# Patient Record
Sex: Female | Born: 1980 | Race: Black or African American | Hispanic: No | Marital: Single | Smoking: Never smoker
Health system: Southern US, Community
[De-identification: ages and names within clinical notes are randomized; demographics above are authoritative.]

## PROBLEM LIST (undated history)

## (undated) DIAGNOSIS — N809 Endometriosis, unspecified: Secondary | ICD-10-CM

## (undated) DIAGNOSIS — K589 Irritable bowel syndrome without diarrhea: Secondary | ICD-10-CM

## (undated) DIAGNOSIS — E785 Hyperlipidemia, unspecified: Secondary | ICD-10-CM

## (undated) DIAGNOSIS — F909 Attention-deficit hyperactivity disorder, unspecified type: Secondary | ICD-10-CM

## (undated) DIAGNOSIS — F32A Depression, unspecified: Secondary | ICD-10-CM

## (undated) DIAGNOSIS — I10 Essential (primary) hypertension: Secondary | ICD-10-CM

## (undated) DIAGNOSIS — F419 Anxiety disorder, unspecified: Secondary | ICD-10-CM

## (undated) HISTORY — DX: Endometriosis, unspecified: N80.9

## (undated) HISTORY — DX: Irritable bowel syndrome without diarrhea: K58.9

## (undated) HISTORY — DX: Hyperlipidemia, unspecified: E78.5

## (undated) HISTORY — DX: Essential (primary) hypertension: I10

## (undated) HISTORY — DX: Attention-deficit hyperactivity disorder, unspecified type: F90.9

## (undated) HISTORY — PX: ABDOMINAL HYSTERECTOMY: SHX81

## (undated) HISTORY — DX: Anxiety disorder, unspecified: F41.9

## (undated) HISTORY — DX: Depression, unspecified: F32.A

---

## 2003-04-14 HISTORY — PX: COLONOSCOPY: SHX174

## 2010-04-13 HISTORY — PX: LAPAROSCOPIC ENDOMETRIOSIS FULGURATION: SUR769

## 2010-04-30 ENCOUNTER — Ambulatory Visit
Admission: RE | Admit: 2010-04-30 | Disposition: A | Discharge status: Home / Self Care | Payer: Self-pay | Source: Ambulatory Visit | Admitting: Gynecology

## 2010-04-30 ENCOUNTER — Ambulatory Visit: Payer: Self-pay

## 2010-05-01 LAB — LAB USE ONLY - HISTORICAL SURGICAL PATHOLOGY

## 2011-02-11 NOTE — Op Note (Signed)
TIMBER, MARSHMAN      MRN:          [id number]      Account:      [id number]      Document ID:  [id number]      Procedure Date: 04/30/2010            Admit Date: 04/30/2010            Patient Location: [facility]      Patient Type: A            SURGEON: Corene Cornea MD      ASSISTANT:                  PREOPERATIVE DIAGNOSIS:      Endometriosis.            POSTOPERATIVE DIAGNOSIS:      Endometriosis.            TITLE OF PROCEDURE:      Laparoscopic resection of endometriosis and enterolysis.            COMPLICATIONS:      None.            CONDITION:      Stable.            ESTIMATED BLOOD LOSS:      Minimal.            DESCRIPTION OF PROCEDURE:      The patient was taken to the operating room.  General anesthesia was      started.  She was prepped and draped in the dorsal lithotomy position.  A      uterine manipulator was placed.  Attention was then turned to the abdomen.      A 5-mm skin incision was made in the umbilicus.  A 5-mm trocar was inserted      under direct visualization of 0-degree scope.  The abdomen was insufflated      with CO2.  One suprapubic 5-mm trocar was inserted.  The pelvis was      examined.  Both tubes and ovaries were found to be normal.  The ovaries did      not appear polycystic at all.  They were normal in shape and appearance      without multiple peripheral cysts.  Both tubes appeared normal.  Uterus      normal.  No fibroids visualized.  There was a small adhesion between the      cecum and the pelvic sidewall.  This was taken down.  The posterior      cul-de-sac was examined.  There was noted to be an implant localized to the      posterior aspect of the uterus and the left uterosacral ligament.  This was      resected using sharp dissection.  Bipolar cautery was used for hemostasis.      The specimen was sent for pathology.  No other implants were visualized.      The pelvis was irrigated.  All instruments and gas removed.  There was a      keloid at the umbilical incision,  which was excised.  Skin reapproximated                                   Page 1 of 2      KENZINGTON, MIELKE      MRN:  [id number]      Account:      [id number]      Document ID:  [id number]      Procedure Date: 04/30/2010            using 0 Vicryl.  Kenalog injection applied to both suprapubic and umbilical      incision for reduction of keloid formation.                        Electronic Signing Provider            D:  04/30/2010 09:44 AM by Dr. Corene Cornea[place] MD ([id number])      T:  04/30/2010 10:27 AM by [id number]                  cc:                                   Page 2 of 2      Authenticated by Corene Cornea, MD ([id number]) On 04/30/2010 12:33:12 PM

## 2012-04-13 HISTORY — PX: COLPOSCOPY: SHX161

## 2014-11-14 ENCOUNTER — Other Ambulatory Visit: Payer: Self-pay | Admitting: Otolaryngology

## 2014-11-14 DIAGNOSIS — J329 Chronic sinusitis, unspecified: Secondary | ICD-10-CM

## 2014-11-19 ENCOUNTER — Ambulatory Visit: Payer: PRIVATE HEALTH INSURANCE

## 2015-02-25 ENCOUNTER — Other Ambulatory Visit: Payer: Self-pay

## 2015-05-20 ENCOUNTER — Emergency Department: Payer: PRIVATE HEALTH INSURANCE

## 2015-05-20 ENCOUNTER — Emergency Department
Admission: EM | Admit: 2015-05-20 | Discharge: 2015-05-20 | Disposition: A | Discharge status: Home / Self Care | Payer: PRIVATE HEALTH INSURANCE | Attending: Emergency Medicine | Admitting: Emergency Medicine

## 2015-05-20 DIAGNOSIS — T2010XA Burn of first degree of head, face, and neck, unspecified site, initial encounter: Secondary | ICD-10-CM | POA: Insufficient documentation

## 2015-05-20 DIAGNOSIS — X088XXA Exposure to other specified smoke, fire and flames, initial encounter: Secondary | ICD-10-CM | POA: Insufficient documentation

## 2015-05-20 DIAGNOSIS — T300 Burn of unspecified body region, unspecified degree: Secondary | ICD-10-CM

## 2015-05-20 NOTE — ED Provider Notes (Signed)
EMERGENCY DEPARTMENT NOTE    Physician/Midlevel provider first contact with patient: 05/20/15 1651         HISTORY OF PRESENT ILLNESS   Historian:Patient  Translator Used: No    Chief Complaint: Burn and Abscess     Mechanism of Injury: Nursing (triage) note reviewed for the following pertinent information:    Pt has bumps on the back of her head and used a hot compress on them, now feels oozing and is concerned she may have burned her scalp. No redness not to area of pain.      34 y.o. female with head pain    1. Location of symptoms: scalp  2. Onset of symptoms: Saturday  3. What was patient doing when symptoms started (Context): see above  4. Severity: moderate  5. Timing: constant  6. Activities that worsen symptoms: touching scalp  7. Activities that improve symptoms: bacitracin  8. Quality: burning pain  9. Radiation of symptoms: no  10. Associated signs and Symptoms: see above  11. Are symptoms worsening? yes  MEDICAL HISTORY     Past Medical History:  No past medical history on file.    Past Surgical History:  No past surgical history on file.    Social History:  Social History     Social History   . Marital Status: Single     Spouse Name: N/A   . Number of Children: N/A   . Years of Education: N/A     Occupational History   . Not on file.     Social History Main Topics   . Smoking status: Not on file   . Smokeless tobacco: Not on file   . Alcohol Use: Not on file   . Drug Use: Not on file   . Sexual Activity: Not on file     Other Topics Concern   . Not on file     Social History Narrative   . No narrative on file       Family History:  No family history on file.    Outpatient Medication:  Previous Medications    No medications on file     REVIEW OF SYSTEMS   Review of Systems   Skin: Positive for rash.     PHYSICAL EXAM   ED Triage Vitals   Enc Vitals Group      BP 05/20/15 1647 167/107 mmHg      Heart Rate 05/20/15 1647 100      Resp Rate 05/20/15 1647 17      Temp 05/20/15 1647 98.4 F (36.9 C)       Temp src --       SpO2 05/20/15 1647 99 %      Weight 05/20/15 1647 61.054 kg      Height 05/20/15 1647 1.575 m      Head Cir --       Peak Flow --       Pain Score 05/20/15 1647 3      Pain Loc --       Pain Edu? --       Excl. in GC? --      General: awake, alert, nontoxic.   EENT: mucous membranes moist  Eyes: Lids normal, no conjunctival injection, no discharge  Lungs: No work of breathing. No tachypnea  CV: regular rhythm.   Abdomen: No distention.   MSK: upper and lower extremities warm and well perfused, neck exhibits FROM by observation  Skin: no rash, skin  warm, skin dry  Neuro: Moving extremities symmetrically and well by observation, no focal weakness, no facial asymetry  Psych: Normal behavior. Developmentally appropriate for age. Appropriate dress      MEDICAL DECISION MAKING     DISCUSSION    Scalp pain after used hot compress and burned scalp.  No open lesions. No erythema. No findings on PE.    Ddx: 1st degree burn.    Plan: dispo with pain control education.      Vital Signs: Reviewed the patient?s vital signs.   Nursing Notes: Reviewed and utilized available nursing notes.  Medical Records Reviewed: Reviewed available past medical records.  Counseling: The emergency provider has spoken with the patient and discussed today?s findings, in addition to providing specific details for the plan of care.  Questions are answered and there is agreement with the plan.      PULSE OXIMETRY    Oxygen Saturation by Pulse Oximetry: 99%  Interventions: none  Interpretation:  Normal pulse ox reading    EMERGENCY DEPT. MEDICATIONS      ED Medication Orders     None          LABORATORY RESULTS    Ordered and independently interpreted AVAILABLE laboratory tests. Please see results section in chart for full details.  No results found for this or any previous visit.      DIAGNOSIS      Diagnosis:  Final diagnoses:   Burn       Disposition:  ED Disposition     Discharge Priscille Kluver discharge to home/self  care.    Condition at disposition: Stable            Prescriptions:  Patient's Medications    No medications on file         Audria Nine, NP  05/20/15 1719

## 2015-05-20 NOTE — Discharge Instructions (Signed)
Burns    You have been seen for a burn.    There are three types of burns:   First-degree burns. These are relatively minor burns on the very top layer of skin. The skin is red and painful but there are no blisters. These burns normally heal without scars. A bad sunburn is a type of first-degree burn.   Second-degree burns. These burns are more serious. They involve deeper layers of the skin. The skin is red, painful, with blisters. Second-degree burns can cause scars.   Third-degree burns. These burns involve deep layers of the skin. They always cause some scars. These burns may or may not be painful.    Take off old dressings every day. Put on a clean, dry dressing. If the dressing sticks to the wound, slightly moisten it with water. This way, it can come off easier.    Put an antibiotic ointment on the burn several times a day. Cover it with a clean, dry dressing. You can buy Polysporin ointment, Silvadene cream, and Bacitracin ointment at the store.    YOU SHOULD SEEK MEDICAL ATTENTION IMMEDIATELY, EITHER HERE OR AT THE NEAREST EMERGENCY DEPARTMENT, IF ANY OF THE FOLLOWING OCCURS:   You see redness or swelling.   There are red streaks coming out from the wound.   The wound smells bad or has a lot of drainage.   Pain when moving the extremities (arms or legs) and / or swollen lymph nodes (nodules normally found in the groin, armpit and neck).   You have fever (temperature higher than 100.4F / 38C), chills, worse pain and / or swelling.

## 2015-05-20 NOTE — ED Notes (Signed)
See ED notes.

## 2016-01-18 DIAGNOSIS — D573 Sickle-cell trait: Secondary | ICD-10-CM

## 2016-01-18 DIAGNOSIS — N809 Endometriosis, unspecified: Secondary | ICD-10-CM | POA: Insufficient documentation

## 2016-01-18 HISTORY — DX: Sickle-cell trait: D57.3

## 2016-03-13 DIAGNOSIS — N840 Polyp of corpus uteri: Secondary | ICD-10-CM

## 2016-03-13 HISTORY — DX: Polyp of corpus uteri: N84.0

## 2016-04-07 ENCOUNTER — Ambulatory Visit: Payer: Commercial Managed Care - POS

## 2016-04-15 ENCOUNTER — Ambulatory Visit
Admission: RE | Admit: 2016-04-15 | Discharge: 2016-04-15 | Disposition: A | Discharge status: Home / Self Care | Payer: Commercial Managed Care - POS | Source: Ambulatory Visit | Attending: Obstetrics & Gynecology | Admitting: Obstetrics & Gynecology

## 2016-04-15 ENCOUNTER — Ambulatory Visit: Payer: Commercial Managed Care - POS | Admitting: Obstetrics & Gynecology

## 2016-04-15 ENCOUNTER — Ambulatory Visit: Payer: Commercial Managed Care - POS | Admitting: Certified Registered"

## 2016-04-15 ENCOUNTER — Encounter
Admission: RE | Disposition: A | Discharge status: Home / Self Care | Payer: Self-pay | Source: Ambulatory Visit | Attending: Obstetrics & Gynecology

## 2016-04-15 ENCOUNTER — Ambulatory Visit: Payer: Self-pay

## 2016-04-15 DIAGNOSIS — Z8742 Personal history of other diseases of the female genital tract: Secondary | ICD-10-CM | POA: Insufficient documentation

## 2016-04-15 DIAGNOSIS — N840 Polyp of corpus uteri: Secondary | ICD-10-CM

## 2016-04-15 HISTORY — PX: D & C, HYSTEROSCOPY: SHX3660

## 2016-04-15 SURGERY — HYSTEROSCOPY, ENDOMETRIAL BIOPSY/POLYPECTOMY, WITH DILATION AND CURETTAGE (D&C)
Anesthesia: Anesthesia General | Site: Pelvis | Wound class: Clean Contaminated

## 2016-04-15 MED ORDER — PROPOFOL 10 MG/ML IV EMUL (WRAP)
INTRAVENOUS | Status: AC
Start: 2016-04-15 — End: ?
  Filled 2016-04-15: qty 20

## 2016-04-15 MED ORDER — MIDAZOLAM HCL 2 MG/2ML IJ SOLN
INTRAMUSCULAR | Status: AC
Start: 2016-04-15 — End: ?
  Filled 2016-04-15: qty 2

## 2016-04-15 MED ORDER — DEXAMETHASONE SODIUM PHOSPHATE 4 MG/ML IJ SOLN (WRAP)
INTRAMUSCULAR | Status: DC | PRN
Start: 2016-04-15 — End: 2016-04-15
  Administered 2016-04-15: 8 mg via INTRAVENOUS

## 2016-04-15 MED ORDER — LIDOCAINE HCL 1 % IJ SOLN
INTRAMUSCULAR | Status: AC
Start: 2016-04-15 — End: ?
  Filled 2016-04-15: qty 20

## 2016-04-15 MED ORDER — LIDOCAINE HCL (PF) 1 % IJ SOLN
INTRAMUSCULAR | Status: DC | PRN
Start: 2016-04-15 — End: 2016-04-15
  Administered 2016-04-15: 10 mL

## 2016-04-15 MED ORDER — FENTANYL CITRATE (PF) 50 MCG/ML IJ SOLN (WRAP)
INTRAMUSCULAR | Status: DC | PRN
Start: 2016-04-15 — End: 2016-04-15
  Administered 2016-04-15 (×2): 50 ug via INTRAVENOUS

## 2016-04-15 MED ORDER — CEFAZOLIN SODIUM 1 G IJ SOLR
INTRAMUSCULAR | Status: AC
Start: 2016-04-15 — End: ?
  Filled 2016-04-15: qty 1000

## 2016-04-15 MED ORDER — PROMETHAZINE HCL 25 MG/ML IJ SOLN
INTRAMUSCULAR | Status: DC | PRN
Start: 2016-04-15 — End: 2016-04-15
  Administered 2016-04-15: 6.25 mg via INTRAMUSCULAR

## 2016-04-15 MED ORDER — LIDOCAINE HCL (PF) 2 % IJ SOLN
INTRAMUSCULAR | Status: AC
Start: 2016-04-15 — End: ?
  Filled 2016-04-15: qty 5

## 2016-04-15 MED ORDER — LIDOCAINE HCL 2 % IJ SOLN
INTRAMUSCULAR | Status: DC | PRN
Start: 2016-04-15 — End: 2016-04-15
  Administered 2016-04-15: 100 mg

## 2016-04-15 MED ORDER — SILVER NITRATE-POT NITRATE 75-25 % EX MISC
CUTANEOUS | Status: DC | PRN
Start: 2016-04-15 — End: 2016-04-15
  Administered 2016-04-15: 1 via TOPICAL

## 2016-04-15 MED ORDER — ONDANSETRON HCL 4 MG/2ML IJ SOLN
INTRAMUSCULAR | Status: AC
Start: 2016-04-15 — End: ?
  Filled 2016-04-15: qty 4

## 2016-04-15 MED ORDER — LACTATED RINGERS IV SOLN
INTRAVENOUS | Status: DC | PRN
Start: 2016-04-15 — End: 2016-04-15

## 2016-04-15 MED ORDER — DEXAMETHASONE SODIUM PHOSPHATE 4 MG/ML IJ SOLN
INTRAMUSCULAR | Status: AC
Start: 2016-04-15 — End: ?
  Filled 2016-04-15: qty 2

## 2016-04-15 MED ORDER — KETOROLAC TROMETHAMINE 30 MG/ML IJ SOLN
INTRAMUSCULAR | Status: DC | PRN
Start: 2016-04-15 — End: 2016-04-15
  Administered 2016-04-15: 30 mg via INTRAVENOUS

## 2016-04-15 MED ORDER — HYDROCODONE-ACETAMINOPHEN 5-325 MG PO TABS
1.0000 | ORAL_TABLET | Freq: Once | ORAL | Status: DC | PRN
Start: 2016-04-15 — End: 2016-04-15

## 2016-04-15 MED ORDER — PROMETHAZINE HCL 25 MG/ML IJ SOLN
INTRAMUSCULAR | Status: AC
Start: 2016-04-15 — End: ?
  Filled 2016-04-15: qty 1

## 2016-04-15 MED ORDER — ONDANSETRON HCL 4 MG/2ML IJ SOLN
INTRAMUSCULAR | Status: DC | PRN
Start: 2016-04-15 — End: 2016-04-15
  Administered 2016-04-15: 8 mg via INTRAVENOUS

## 2016-04-15 MED ORDER — FENTANYL CITRATE (PF) 50 MCG/ML IJ SOLN (WRAP)
INTRAMUSCULAR | Status: AC
Start: 2016-04-15 — End: ?
  Filled 2016-04-15: qty 2

## 2016-04-15 MED ORDER — CEFAZOLIN SODIUM 1 G IJ SOLR
INTRAMUSCULAR | Status: DC | PRN
Start: 2016-04-15 — End: 2016-04-15
  Administered 2016-04-15: 1 g via INTRAVENOUS

## 2016-04-15 MED ORDER — MIDAZOLAM HCL 2 MG/2ML IJ SOLN
INTRAMUSCULAR | Status: DC | PRN
Start: 2016-04-15 — End: 2016-04-15
  Administered 2016-04-15: 2 mg via INTRAVENOUS

## 2016-04-15 MED ORDER — SUCCINYLCHOLINE CHLORIDE 20 MG/ML IJ SOLN
INTRAMUSCULAR | Status: DC | PRN
Start: 2016-04-15 — End: 2016-04-15
  Administered 2016-04-15: 60 mg via INTRAVENOUS

## 2016-04-15 MED ORDER — PROPOFOL 10 MG/ML IV EMUL (WRAP)
INTRAVENOUS | Status: DC | PRN
Start: 2016-04-15 — End: 2016-04-15
  Administered 2016-04-15 (×2): 200 mg via INTRAVENOUS

## 2016-04-15 SURGICAL SUPPLY — 20 items
CATH URETHRAL RED RUBBER 16F (Catheter Urine) ×1 IMPLANT
COVER LIGHT HANDLE 2PK (Procedure Accessories) ×2 IMPLANT
GLOVE SRG NTR RBR 8 INDCTR BGL 299X103MM (Glove) ×2
GLOVE SURG BIOGEL SZ7.5 (Glove) ×2 IMPLANT
GLOVE SURGICAL 8 INDICATOR BIOGEL POWDER (Glove) ×1
GLOVE SURGICAL 8 INDICATOR BIOGEL POWDER FREE SMOOTH BEAD CUFF (Glove) ×1 IMPLANT
NEEDLE SPINAL BD OD22 GA L3 1/2 IN (Needles) ×1
NEEDLE SPINAL L3 1/2 IN REGULAR WALL QUINCKE TIP OD22 GA BD (Needles) IMPLANT
NEEDLE SPNL PP RW BD QNCK 22GA 3.5IN LF (Needles) ×2
PACK LITHOTOMY MINOR (Pack) ×2 IMPLANT
SET IRR DEHP 10 GTT/ML STRG 81IN LF STRL (Tubing) ×2
SET IRRIGATION L81 IN 10 GTT/ML STRAIGHT (Tubing) ×1
SET IRRIGATION L81 IN 10 GTT/ML STRAIGHT NA DEHP BLADDER REGULATE (Tubing) ×1 IMPLANT
SOLUTION IRR 0.9% NACL 1000ML LF STRL (Irrigation Solutions) ×2
SOLUTION IRRIGATION 0.9% SODIUM CHLORIDE (Irrigation Solutions) ×1
SOLUTION IRRIGATION 0.9% SODIUM CHLORIDE 1000 ML PLASTIC POUR BOTTLE (Irrigation Solutions) ×1 IMPLANT
SYRINGE 10 ML CONTROL CONCENTRIC TIP (Syringes, Needles) ×1
SYRINGE 10 ML CONTROL CONCENTRIC TIP PYROGEN FREE DEHP FREE LOK (Syringes, Needles) IMPLANT
SYRINGE MED 10ML LL LF STRL CNTRL CONC (Syringes, Needles) ×2
UNDERPAD INCNT PP SMP TNDRSRB 36X23IN LF (Patient Supply) ×2 IMPLANT

## 2016-04-15 NOTE — Anesthesia Postprocedure Evaluation (Signed)
Anesthesia Post Evaluation    Patient: Erika Mccarty    Procedures performed: Procedure(s):  D & C, HYSTEROSCOPY    Anesthesia type: General LMA    Patient location:PACU    Last vitals:   Vitals:    04/15/16 1027   BP: 141/81   Pulse: 75   Resp: 15   Temp:    SpO2: 100%       Post pain: Patient not complaining of pain, continue current therapy      Mental Status:awake and alert     Respiratory Function: tolerating room air    Cardiovascular: stable    Nausea/Vomiting: patient not complaining of nausea or vomiting    Hydration Status: adequate    Post assessment: no apparent anesthetic complications    Signed by: Manuella Ghazi , 04/15/2016 2:19 PM

## 2016-04-15 NOTE — Transfer of Care (Signed)
Anesthesia Transfer of Care Note    Patient: Erika Mccarty    Procedures performed: Procedure(s):  D & C, HYSTEROSCOPY    Anesthesia type: General LMA    Patient location:Phase I PACU    Last vitals:   Vitals:    04/15/16 0930   BP: 120/66   Pulse: (!) 111   Resp: 16   Temp: 36.1 C (97 F)   SpO2: 96%       Post pain: Patient not complaining of pain, continue current therapy      Mental Status:drowsy    Respiratory Function: tolerating face mask    Cardiovascular: stable    Nausea/Vomiting: patient not complaining of nausea or vomiting    Hydration Status: adequate    Post assessment: no apparent anesthetic complications, no reportable events and no evidence of recall    Signed by: Almyra Free  04/15/16 9:30 AM

## 2016-04-15 NOTE — Discharge Instr - AVS First Page (Signed)
Reason for your Hospital Admission:  D&C      Instructions for after your discharge:    Discharge Instructions for Dilatation and Curettage (D and C)  Your doctor performed dilatation and curettage (D&C). The reasons for having this procedure vary from person to person. The D&C may be performed to control heavy uterine bleeding, to find the cause of irregular bleeding, or to remove pregnancy tissue if you have had a miscarriage.  Home Care   Take it easy.    Return to your normal activities after 24-48 hours. You may also return to work at that time.   Eat a normal diet.   Take an over-the-counter pain reliever for pain, if needed.   Remember, it's okay to have bleeding for about a week after the procedure. The amount of bleeding should be similar to what you have during a normal period.   Don't lift anything heavier than 10 pounds for 1 week after the procedure.   Don't drive for 24 hours after the procedure.   Don't have sexual intercourse or use tampons or douches until your doctor says it's safe to do so. This usually takes 2 weeks.  Follow-Up   Make a follow-up appointment in 2 weeks.    When to Call Your Doctor  Call your doctor immediately if you have any of the following:   Bleeding that soaks more than one sanitary pad in one hour   Severe abdominal pain   Severe cramps   Fever above 101.0F   Chills   Smelly discharge from your vagina

## 2016-04-15 NOTE — Discharge Instructions (Signed)

## 2016-04-15 NOTE — Anesthesia Preprocedure Evaluation (Signed)
Anesthesia Evaluation    AIRWAY    Mallampati: II    TM distance: >3 FB  Neck ROM: full  Mouth Opening:full   CARDIOVASCULAR    cardiovascular exam normal       DENTAL    no notable dental hx     PULMONARY    pulmonary exam normal     OTHER FINDINGS              Relevant Problems   No active problems are marked relevant to this note.               Anesthesia Plan    ASA 2     general                     intravenous induction   Detailed anesthesia plan: general LMA        Post op pain management: per surgeon    informed consent obtained    Plan discussed with CRNA.                   Signed by: Manuella Ghazi  04/15/16 8:12 AM

## 2016-04-15 NOTE — Op Note (Signed)
Operative Report    Date of Procedure:  04/15/2016    Patient Name:  Erika Mccarty    Pre-Op Dx:  Polyp of corpus uteri    Post-Op Dx:  same    Procedure:  Procedure(s):  D & C, HYSTEROSCOPY    Surgeon:  Surgeon(s):  Marveline Profeta, Graciella Belton, MD    Assist:  Circulator: Lina Sayre, RN  Scrub Person: Mayford Knife, MONICA    Anesthesia:  Choice    Complications:  none    EBL: Minimal        Specimen:      SPECIMENS (last 24 hours)      Pathology Specimens     Row Name 04/15/16 0900                Specimen Information    Specimen Testing Required Routine Pathology       Specimen ID  [id number]       Specimen Description endometrial currettings and polyp             Findings: sub-centimeter polyp noted, otherwise normal EM cavity      Procedure:    After informed consent, the patient was taken to the operating room where adequate anesthesia was obtained.  She was prepped and draped in the usual sterile fashion, and examination revealed findings above.  A bivalve speculum was placed in the vagina, and the anterior lip of the cervix was grasped with a single tooth tenaculum.  Paracervical block was performed with 10 mL of 1% lidocaine.  The cervix was serially dilated, and the hysteroscope was advanced into the uterus with normal saline as the distension medium.  Bilateral tubal ostia were identified.  There was noted to be a small polyp in the uterine cavity.  Gentle sharp curettage was then performed.  All instruments were removed from the uterus and cervix with hemostasis noted on the cervix.  Normal saline input and output .  The patient tolerated the procedure well.  All sponge, instrument, and needle counts were correct times two.        Gardiner Fanti, MD  04/15/2016 9:05 AM

## 2016-04-17 ENCOUNTER — Encounter: Payer: Self-pay | Admitting: Obstetrics & Gynecology

## 2016-04-21 LAB — LAB USE ONLY - HISTORICAL SURGICAL PATHOLOGY

## 2016-05-18 ENCOUNTER — Emergency Department: Payer: Commercial Managed Care - POS

## 2016-05-18 ENCOUNTER — Emergency Department
Admission: EM | Admit: 2016-05-18 | Discharge: 2016-05-18 | Disposition: A | Discharge status: Home / Self Care | Payer: Commercial Managed Care - POS | Attending: Emergency Medicine | Admitting: Emergency Medicine

## 2016-05-18 DIAGNOSIS — M79661 Pain in right lower leg: Secondary | ICD-10-CM | POA: Insufficient documentation

## 2016-05-18 MED ORDER — IBUPROFEN 600 MG PO TABS
600.0000 mg | ORAL_TABLET | Freq: Once | ORAL | Status: AC
Start: 2016-05-18 — End: 2016-05-18
  Administered 2016-05-18: 600 mg via ORAL
  Filled 2016-05-18: qty 1

## 2016-05-18 MED ORDER — IBUPROFEN 600 MG PO TABS
600.0000 mg | ORAL_TABLET | Freq: Three times a day (TID) | ORAL | 0 refills | Status: DC | PRN
Start: 2016-05-18 — End: 2016-06-24

## 2016-05-18 NOTE — ED Provider Notes (Signed)
EMERGENCY DEPARTMENT HISTORY AND PHYSICAL EXAM    Date: 05/18/2016  Patient Name: Erika Mccarty  Attending Physician:  Kelly Splinter, MD  Diagnosis and Treatment Plan       Clinical Impression:   1. Right calf pain        Treatment Plan:   ED Disposition     ED Disposition Condition Date/Time Comment    Discharge  Mon May 18, 2016  2:07 PM Erika Mccarty discharge to home/self care.    Condition at disposition: Stable          History of Presenting Illness     Chief Complaint   Patient presents with   . Leg Pain     right lower leg       History Provided By: Pt  Chief Complaint: R calf pain    Additional History: Erika Mccarty is a 36 y.o. female c/o of R calf pain s/p rolling her ankle a month ago. States pain initially started 1 month ago. She never had x ray. Her tightness sensation in calf has since worsened. Describes it as "like my muscles are all tangled." She notes no relief from Motrin/Tylenol. She hasn't taken it today. She is not on birth control. She is not a smoker. Denies possibility of pregnancy.    Denies any CP, heart palpitations, R leg numbness, or other complaints.    PCP: Pollie Meyer, MD    No current facility-administered medications for this encounter.     Current Outpatient Prescriptions:   .  ibuprofen (ADVIL,MOTRIN) 600 MG tablet, Take 1 tablet (600 mg total) by mouth every 8 (eight) hours as needed for Pain., Disp: 20 tablet, Rfl: 0  .  sertraline (ZOLOFT) 50 MG tablet, take 1 tablet by mouth once daily, Disp: , Rfl: 0    Past Medical History     Past Medical History:   Diagnosis Date   . Anxiety    . Depression    . Endometriosis    . Irritable bowel syndrome    . Uterine polyp 03/2016     Past Surgical History:   Procedure Laterality Date   . D & C, HYSTEROSCOPY N/A 04/15/2016    Procedure: D & C, HYSTEROSCOPY;  Surgeon: Ahdoot, Graciella Belton, MD;  Location: [facility];  Service: Gynecology;  Laterality: N/A;   . LAPAROSCOPIC ENDOMETRIOSIS FULGURATION  04/2010    Lap resection of  endometriosis       Family History     Family History   Problem Relation Age of Onset   . Malignant hyperthermia Neg Hx    . Anesthesia problems Neg Hx        Social History     Social History     Social History   . Marital status: Single     Spouse name: N/A   . Number of children: N/A   . Years of education: N/A     Social History Main Topics   . Smoking status: Never Smoker   . Smokeless tobacco: Never Used   . Alcohol use No   . Drug use: No   . Sexual activity: Not on file     Other Topics Concern   . Not on file     Social History Narrative   . No narrative on file       Allergies     No Known Allergies    Review of Systems     Review of Systems  Cardiovascular: Negative for chest pain and palpitations.   Musculoskeletal:        + R calf pain   Allergic/Immunologic:        NKDA   Neurological: Negative for numbness.   Psychiatric/Behavioral: Negative for suicidal ideas.   Patient asked and all other systems reviewed and are negative for acute complaints/concerns.    Physical Exam     BP 140/88   Pulse 89   Temp 98.7 F (37.1 C) (Oral)   Resp 17   Wt 65.8 kg   LMP 04/26/2016   SpO2 100%   BMI 26.52 kg/m   Pulse Oximetry Analysis - Normal 100% On RA    Physical Exam   Constitutional: She is oriented to person, place, and time. She appears well-developed and well-nourished.   Head: Normocephalic and atraumatic.   Eyes: No scleral icterus.   Neck: Normal range of motion. Neck supple.   Cardiovascular: Intact distal pulses.    Pulmonary/Chest: Effort normal. No respiratory distress.   Neurological: She is alert and oriented to person, place, and time.   Skin: Skin is warm and dry.   Psychiatric: She has a normal mood and affect. Her behavior is normal. Judgment and thought content normal.   Elevated bp noted  2+ dp and pt pulses, normal sensation toes and good cap refill, no pain with arom/prom of the toes and ankle, the calf on the right is larger than the left but no pitting edema, no rash, no palpable  deformity but calf is tender    Diagnostic Study Results     Labs -     Results     ** No results found for the last 24 hours. **          Radiologic Studies -   Radiology Results (24 Hour)     Procedure Component Value Units Date/Time    [place] VenoDopp Low Extremity Right [[id number]] Collected:  05/18/16 1357    Order Status:  Completed Updated:  05/18/16 1402    Narrative:       CLINICAL INDICATIONS: Right lower extremity pain and swelling with  question of DVT.    TECHNIQUE: Duplex evaluation of the veins of the right lower extremity  is performed from the lower pelvis to the upper calf with gray scale  imaging, transverse compression and gated and color Doppler techniques.  Additional imaging is performed in the contralateral iliofemoral region.    INTERPRETATION: Examination of the venous system of the right lower  extremity demonstrates no evidence of intraluminal thrombus or  obstruction to venous flow. Normal phasicity is present at the  iliofemoral junctions indicating no central obstruction. There is normal  coaptation of the femoropopliteal vein throughout its course with  transverse compression. The saphenofemoral junction is also noted to be  widely patent. Deep and superficial veins of the right calf are also  demonstrated to be patent without thrombus or obstruction. When compared  to the contralateral iliofemoral junction, phasicity is normal and  symmetric.      Impression:        NORMAL VENOUS DUPLEX OF THE RIGHT LOWER EXTREMITY. NO  EVIDENCE OF DEEP VENOUS THROMBOSIS.    This study was read in conjunction with Dr. Alphonzo Grieve.    Ronalee Red, MD   05/18/2016 1:58 PM    Ankle Right 3+ Views [[id number]] Collected:  05/18/16 1242    Order Status:  Completed Updated:  05/18/16 1247    Narrative:  History:Eversion injury, pain    Comparison: None    Findings:  No acute fracture or dislocation. Mild pretibial soft tissue prominence.  Ankle mortise is intact.      Impression:         No acute osseous findings.        Lorinda Creed, MD   05/18/2016 12:43 PM    Tibia Fibula Right AP and Lateral [[id number]] Collected:  05/18/16 1242    Order Status:  Completed Updated:  05/18/16 1247    Narrative:              History:Eversion injury, pain    Comparison: None    Findings:  No acute fracture or dislocation. Mild pretibial soft tissue prominence.  Ankle mortise is intact.      Impression:        No acute osseous findings.        Lorinda Creed, MD   05/18/2016 12:43 PM      .    Doctor's Notes     Throughout the stay in the Emergency Department, questions and concerns surrounding pain control, care plans, diagnostic studies, effects of medications administered or prescribed, and future prognostic dilemmas were assessed and addressed.    ROS addendum: The patient and/or family was asked if they had any other complaints or concerns that we could address today and nothing of significance was noted.     IMP & PLAN: possible muscle injury with edema, no signs compartment syndrome, denies pe sxs  Here for ultrasound, given the swollen calf we will also ensure no occult fx    12:13 PM - Discussed treatment plan with pt. Pt is agreeable with plan.    1:55 PM - Updated pt on all results. She's agreeable with plan.    2:07 PM - Rx use and side effects, results, home self care, discharge instructions, and return precautions discussed extensively with patient. Possibility of evolving illness reviewed. All questions solicited and addressed. Patient is amenable to discharge. bp improved and likely reflective of pain/stress.  Ambulating normally.    Asymptomatic hypertension treatment today as per ACEP guidelines, no new medications, no acute adjustment in pressure and no screening  labs/ekg/cts:    [email or web address]  _______________________________  Medical DeMedical Decision Makingcision Making  Attestations:     Physician/Midlevel provider first contact with patient: 05/18/16 1205         This note is prepared for Kelly Splinter, MD. The scribe's documentation has been prepared under my direction and personally reviewed by me in its entirety.  I confirm that the note above accurately reflects all work, treatment, procedures, and medical decision making performed by me.     I am the first provider for this patient.      Kelly Splinter, MD is the primary emergency doctor of record.      I reviewed the vital signs, available nursing notes, past medical history, past surgical history, family history and social history.    _______________________________         Westley Foots, MD  05/19/16 [phone]

## 2016-05-18 NOTE — ED Triage Notes (Signed)
Erika Mccarty is a 36 y.o. female who presents to ED with c/o right LE pain after rolling her right ankle approximately 1 month ago. States "it feels like the muscles are all twisted". [facility] and advised to come to ED for potential DVT. Reports occasional SOB, denies fevers, dizziness, CP.     BP (!) 173/109   Pulse (!) 101   Temp 98 F (36.7 C)   Resp 18   Wt 65.8 kg   LMP 04/26/2016   SpO2 97%   BMI 26.52 kg/m

## 2016-05-18 NOTE — Discharge Instructions (Signed)
Leg Pain    You have been diagnosed with leg pain.  IN YOUR CASE YOU HAVE LIKELY INJURED THE MUSCLES AS DISCUSSED AND THERE CAN BE BE SWELLING AS THE MUSCLES HEAL.    Many things can cause leg pain. Most of the causes are not dangerous and will get better on their own. These can include muscle cramps, bruises, strains, pinched nerves, and minor skin infections.    You had an exam by your doctor today. He or she decided that the cause of your leg pain does not seem to be serious or dangerous.    During your visit, you had an ultrasound (duplex doppler) to make sure that you didn't have a blood clot in your leg.    If your pain continues, you might need another exam or more tests. This is to find out why you have leg pain. The cause of your symptoms doesn't seem dangerous now. You don't need to stay in the hospital.    Though we don't believe your condition is dangerous right now, it is important to be careful. Sometimes a problem that seems mild can become serious later. This is why it is very important that you return here or go to the nearest Emergency Department if you are not improving or your symptoms are getting worse.    Some things you may try at home are:    Over-the-counter pain medications like that have ibuprofen (Advil/Motrin) or acetaminophen (Tylenol) in them. Follow the directions on the package.   Rest the leg as needed. As soon as you are able, start moving your leg again to keep it from getting stiff.    Return here or go to the nearest Emergency Department or follow-up with your doctor if you are not getting better as expected.    Follow the instructions for any medication you are prescribed.     YOU SHOULD SEEK MEDICAL ATTENTION IMMEDIATELY, EITHER HERE OR AT THE NEAREST EMERGENCY DEPARTMENT, IF ANY OF THE FOLLOWING HAPPENS:   You have a fever (temperature higher than 100.53F or 38C).   Your pain does not go away or gets worse.   Your leg or joints (hip, knee, ankle, etc.) are  red or swollen.   Your chest hurts or you get short of breath.   You have any other symptoms or concerns, or don't get better as expected.    If you can't follow up with your doctor, or if at any time you feel you need to be rechecked or seen again, come back here or go to the nearest emergency department.       If your swelling worsens or doesn't get better in 6 weeks you need to return for review.  If you have a rash or fever you must return.  If you have chest pain or palpitations or shortness of breath you must return.  If you have numbness or swelling in the foot or if your foot because pale you must return.  If you have pain with wiggling your does or feel you have weakness in your ankle you must return.

## 2016-06-24 ENCOUNTER — Ambulatory Visit: Payer: Commercial Managed Care - POS

## 2016-06-24 ENCOUNTER — Encounter (HOSPITAL_BASED_OUTPATIENT_CLINIC_OR_DEPARTMENT_OTHER): Payer: Self-pay

## 2016-06-24 DIAGNOSIS — Z01818 Encounter for other preprocedural examination: Secondary | ICD-10-CM

## 2016-06-24 NOTE — Pre-Procedure Instructions (Addendum)
06/19/16 cbc, cmp - wdl in epic  Pt plans to come to [facility] on 07/04/16 for preop testing T&S, orders entered according to anesthesia guidelines.  No further tests per guidelines, nor requested from sx per pt.     Lab Tech: need T&S

## 2016-07-04 ENCOUNTER — Ambulatory Visit: Payer: Commercial Managed Care - POS | Attending: Gynecologic Oncology

## 2016-07-04 DIAGNOSIS — Z01818 Encounter for other preprocedural examination: Secondary | ICD-10-CM

## 2016-07-04 LAB — TYPE AND SCREEN
AB Screen Gel: NEGATIVE
ABO Rh: B POS

## 2016-07-06 NOTE — Pre-Procedure Instructions (Signed)
Reviewed 07/04/16 type and screen.

## 2016-07-13 NOTE — H&P (Signed)
GYNONC Preop H&P      This H&P was written from available EPIC records and attendings office notes.       Erika Mccarty is a 36 y.o. yo  presenting to [facility] for scheduled surgery.    Review of Systems (per o/p Records on date 04/14/2016)  Denies fever, chills, headache, visual changes, lightheadedness, dizziness, SOB, chest pain, palpitations, N/V, diarrhea, constipation, dysuria, hematuria      PMH:  Past Medical History:   Diagnosis Date   . Endometriosis    . Irritable bowel syndrome     controled w/diet   . Sickle cell trait 01/18/2016    no issues   . Uterine polyp 03/2016    surgery - 04/2016       PSHX:  Past Surgical History:   Procedure Laterality Date   . COLONOSCOPY  2005   . COLPOSCOPY  04/2012   . D & C, HYSTEROSCOPY N/A 04/15/2016    Procedure: D & C, HYSTEROSCOPY;  Surgeon: Ahdoot, Graciella Belton, MD;  Location: [facility] MA[facility] OR;  Service: Gynecology;  Laterality: N/A;   . LAPAROSCOPIC ENDOMETRIOSIS FULGURATION  04/2010    Lap resection of endometriosis       Fam hx:  Family History   Problem Relation Age of Onset   . Malignant hyperthermia Neg Hx    . Anesthesia problems Neg Hx        Soc Hx:  Social History     Social History   . Marital status: Single     Spouse name: N/A   . Number of children: N/A   . Years of education: N/A     Social History Main Topics   . Smoking status: Never Smoker   . Smokeless tobacco: Never Used   . Alcohol use No   . Drug use: No   . Sexual activity: Not on file     Other Topics Concern   . Not on file     Social History Narrative   . No narrative on file       Meds:  No prescriptions prior to admission.       All:  No Known Allergies    PE:  There were no vitals filed for this visit.    Exam:  (Per o/p records on date 04/14/2016)    General: NAD  Heart: RRR  Lungs: CTAB    Rest of the exam deferred for OR      A/P: 37 y.o. with endometrial hyperplasia with atypia, here for  Laparoscopic Total Hysterectomy , BSO.  Proceed with scheduled surgery.  ERAS protocol  Lovenox and SCDs  pre-op  2g Ancef pre-op    Erika Burton, MD     Please note that this note will be addended on day of surgery by surgical attending

## 2016-07-16 ENCOUNTER — Encounter (HOSPITAL_BASED_OUTPATIENT_CLINIC_OR_DEPARTMENT_OTHER): Payer: Self-pay

## 2016-07-16 ENCOUNTER — Inpatient Hospital Stay (HOSPITAL_BASED_OUTPATIENT_CLINIC_OR_DEPARTMENT_OTHER): Payer: Commercial Managed Care - POS | Admitting: Certified Registered Nurse Anesthetist

## 2016-07-16 ENCOUNTER — Ambulatory Visit
Admission: RE | Admit: 2016-07-16 | Discharge: 2016-07-17 | Disposition: A | Discharge status: Home / Self Care | Payer: Commercial Managed Care - POS | Source: Ambulatory Visit | Attending: Gynecologic Oncology | Admitting: Gynecologic Oncology

## 2016-07-16 ENCOUNTER — Encounter (HOSPITAL_BASED_OUTPATIENT_CLINIC_OR_DEPARTMENT_OTHER)
Admission: RE | Disposition: A | Discharge status: Home / Self Care | Payer: Self-pay | Source: Ambulatory Visit | Attending: Gynecologic Oncology

## 2016-07-16 ENCOUNTER — Ambulatory Visit (HOSPITAL_BASED_OUTPATIENT_CLINIC_OR_DEPARTMENT_OTHER): Payer: Commercial Managed Care - POS | Admitting: Gynecologic Oncology

## 2016-07-16 DIAGNOSIS — K589 Irritable bowel syndrome without diarrhea: Secondary | ICD-10-CM | POA: Insufficient documentation

## 2016-07-16 DIAGNOSIS — D251 Intramural leiomyoma of uterus: Secondary | ICD-10-CM | POA: Insufficient documentation

## 2016-07-16 DIAGNOSIS — N800 Endometriosis of uterus: Secondary | ICD-10-CM

## 2016-07-16 DIAGNOSIS — N8502 Endometrial intraepithelial neoplasia [EIN]: Secondary | ICD-10-CM | POA: Insufficient documentation

## 2016-07-16 DIAGNOSIS — N838 Other noninflammatory disorders of ovary, fallopian tube and broad ligament: Secondary | ICD-10-CM | POA: Insufficient documentation

## 2016-07-16 DIAGNOSIS — I10 Essential (primary) hypertension: Secondary | ICD-10-CM | POA: Insufficient documentation

## 2016-07-16 HISTORY — PX: LAPAROSCOPIC, HYSTERECTOMY, TOTAL, BSO: SHX4523

## 2016-07-16 HISTORY — PX: CYSTOSCOPY: SHX3552

## 2016-07-16 LAB — POCT PREGNANCY TEST, URINE HCG: POCT Pregnancy HCG Test, UR: NEGATIVE

## 2016-07-16 SURGERY — LAPAROSCOPIC, HYSTERECTOMY, TOTAL, BSO
Anesthesia: Anesthesia General | Site: Pelvis | Wound class: Clean Contaminated

## 2016-07-16 MED ORDER — ACETAMINOPHEN 500 MG PO TABS
1000.0000 mg | ORAL_TABLET | Freq: Once | ORAL | Status: DC | PRN
Start: 2016-07-16 — End: 2016-07-16

## 2016-07-16 MED ORDER — PHENYLEPHRINE 100 MCG/ML IN NACL 0.9% IV SOSY
PREFILLED_SYRINGE | INTRAVENOUS | Status: AC
Start: 2016-07-16 — End: ?
  Filled 2016-07-16: qty 5

## 2016-07-16 MED ORDER — DEXAMETHASONE SODIUM PHOSPHATE 20 MG/5ML IJ SOLN
INTRAMUSCULAR | Status: AC
Start: 2016-07-16 — End: ?
  Filled 2016-07-16: qty 5

## 2016-07-16 MED ORDER — ONDANSETRON HCL 4 MG/2ML IJ SOLN
4.0000 mg | Freq: Once | INTRAMUSCULAR | Status: DC | PRN
Start: 2016-07-16 — End: 2016-07-16

## 2016-07-16 MED ORDER — BUPIVACAINE-EPINEPHRINE (PF) 0.25% -1:200000 IJ SOLN
INTRAMUSCULAR | Status: AC
Start: 2016-07-16 — End: ?
  Filled 2016-07-16: qty 30

## 2016-07-16 MED ORDER — ENOXAPARIN SODIUM 40 MG/0.4ML SC SOLN
40.0000 mg | Freq: Once | SUBCUTANEOUS | Status: AC
Start: 2016-07-16 — End: 2016-07-16
  Administered 2016-07-16: 40 mg via SUBCUTANEOUS

## 2016-07-16 MED ORDER — PROCHLORPERAZINE EDISYLATE 5 MG/ML IJ SOLN
5.0000 mg | Freq: Four times a day (QID) | INTRAMUSCULAR | Status: DC | PRN
Start: 2016-07-16 — End: 2016-07-17

## 2016-07-16 MED ORDER — FENTANYL CITRATE (PF) 50 MCG/ML IJ SOLN (WRAP)
INTRAMUSCULAR | Status: DC | PRN
Start: 2016-07-16 — End: 2016-07-16
  Administered 2016-07-16: 100 ug via INTRAVENOUS
  Administered 2016-07-16 (×2): 50 ug via INTRAVENOUS

## 2016-07-16 MED ORDER — GLYCOPYRROLATE 0.2 MG/ML IJ SOLN
INTRAMUSCULAR | Status: DC | PRN
Start: 2016-07-16 — End: 2016-07-16
  Administered 2016-07-16: .5 mg via INTRAVENOUS

## 2016-07-16 MED ORDER — PROPOFOL 10 MG/ML IV EMUL (WRAP)
INTRAVENOUS | Status: AC
Start: 2016-07-16 — End: ?
  Filled 2016-07-16: qty 50

## 2016-07-16 MED ORDER — OXYCODONE HCL 5 MG PO TABS
5.0000 mg | ORAL_TABLET | ORAL | Status: DC | PRN
Start: 2016-07-16 — End: 2016-07-17
  Administered 2016-07-16 (×2): 5 mg via ORAL
  Filled 2016-07-16 (×2): qty 1

## 2016-07-16 MED ORDER — ONDANSETRON HCL 4 MG/2ML IJ SOLN
4.0000 mg | Freq: Four times a day (QID) | INTRAMUSCULAR | Status: DC | PRN
Start: 2016-07-16 — End: 2016-07-17

## 2016-07-16 MED ORDER — NEOSTIGMINE METHYLSULFATE 1 MG/ML IJ/IV SOLN (WRAP)
Status: AC
Start: 2016-07-16 — End: ?
  Filled 2016-07-16: qty 5

## 2016-07-16 MED ORDER — MIDAZOLAM HCL 2 MG/2ML IJ SOLN
INTRAMUSCULAR | Status: DC | PRN
Start: 2016-07-16 — End: 2016-07-16
  Administered 2016-07-16: 2 mg via INTRAVENOUS

## 2016-07-16 MED ORDER — GABAPENTIN 100 MG PO CAPS
ORAL_CAPSULE | ORAL | Status: AC
Start: 2016-07-16 — End: ?
  Filled 2016-07-16: qty 1

## 2016-07-16 MED ORDER — SIMETHICONE 80 MG PO CHEW
80.0000 mg | CHEWABLE_TABLET | Freq: Four times a day (QID) | ORAL | Status: DC | PRN
Start: 2016-07-16 — End: 2016-07-17
  Administered 2016-07-16 – 2016-07-17 (×2): 80 mg via ORAL
  Filled 2016-07-16 (×2): qty 1

## 2016-07-16 MED ORDER — PROPOFOL 10 MG/ML IV EMUL (WRAP)
INTRAVENOUS | Status: DC | PRN
Start: 2016-07-16 — End: 2016-07-16
  Administered 2016-07-16: 50 mg via INTRAVENOUS
  Administered 2016-07-16: 150 mg via INTRAVENOUS

## 2016-07-16 MED ORDER — ACETAMINOPHEN 325 MG PO TABS
650.0000 mg | ORAL_TABLET | Freq: Four times a day (QID) | ORAL | Status: DC
Start: 2016-07-16 — End: 2016-07-17
  Administered 2016-07-16 – 2016-07-17 (×4): 650 mg via ORAL
  Filled 2016-07-16 (×4): qty 2

## 2016-07-16 MED ORDER — OXYCODONE HCL 5 MG PO TABS
5.0000 mg | ORAL_TABLET | ORAL | 0 refills | Status: AC | PRN
Start: 2016-07-16 — End: ?

## 2016-07-16 MED ORDER — ONDANSETRON HCL 4 MG/2ML IJ SOLN
INTRAMUSCULAR | Status: AC
Start: 2016-07-16 — End: ?
  Filled 2016-07-16: qty 2

## 2016-07-16 MED ORDER — LACTATED RINGERS IV SOLN
INTRAVENOUS | Status: DC
Start: 2016-07-16 — End: 2016-07-16

## 2016-07-16 MED ORDER — OXYCODONE HCL 5 MG PO TABS
10.0000 mg | ORAL_TABLET | ORAL | Status: DC | PRN
Start: 2016-07-16 — End: 2016-07-17

## 2016-07-16 MED ORDER — PROPOFOL INFUSION 10 MG/ML
INTRAVENOUS | Status: DC | PRN
Start: 2016-07-16 — End: 2016-07-16
  Administered 2016-07-16: 50 ug/kg/min via INTRAVENOUS

## 2016-07-16 MED ORDER — LIDOCAINE HCL 2 % IJ SOLN
INTRAMUSCULAR | Status: DC | PRN
Start: 2016-07-16 — End: 2016-07-16
  Administered 2016-07-16: 100 mg

## 2016-07-16 MED ORDER — ACETAMINOPHEN 500 MG PO TABS
ORAL_TABLET | ORAL | Status: AC
Start: 2016-07-16 — End: ?
  Filled 2016-07-16: qty 2

## 2016-07-16 MED ORDER — FLUORESCEIN SODIUM 10 % IV SOLN
INTRAVENOUS | Status: DC | PRN
Start: 2016-07-16 — End: 2016-07-16
  Administered 2016-07-16: .25 mL via INTRAVENOUS

## 2016-07-16 MED ORDER — TRAMADOL HCL 50 MG PO TABS
50.0000 mg | ORAL_TABLET | Freq: Four times a day (QID) | ORAL | Status: DC
Start: 2016-07-17 — End: 2016-07-16

## 2016-07-16 MED ORDER — STERILE WATER FOR IRRIGATION IR SOLN
Status: DC | PRN
Start: 2016-07-16 — End: 2016-07-16
  Administered 2016-07-16: 1000 mL
  Administered 2016-07-16: 200 mL

## 2016-07-16 MED ORDER — DEXAMETHASONE SODIUM PHOSPHATE 4 MG/ML IJ SOLN (WRAP)
INTRAMUSCULAR | Status: DC | PRN
Start: 2016-07-16 — End: 2016-07-16
  Administered 2016-07-16: 6 mg via INTRAVENOUS

## 2016-07-16 MED ORDER — SODIUM CHLORIDE 0.9 % IV SOLN
INTRAVENOUS | Status: DC
Start: 2016-07-16 — End: 2016-07-17

## 2016-07-16 MED ORDER — LACTATED RINGERS IV SOLN
125.0000 mL/h | INTRAVENOUS | Status: DC
Start: 2016-07-16 — End: 2016-07-16
  Administered 2016-07-16: 125 mL/h via INTRAVENOUS

## 2016-07-16 MED ORDER — ONDANSETRON 4 MG PO TBDP
4.0000 mg | ORAL_TABLET | Freq: Four times a day (QID) | ORAL | Status: DC | PRN
Start: 2016-07-16 — End: 2016-07-17

## 2016-07-16 MED ORDER — FENTANYL CITRATE (PF) 50 MCG/ML IJ SOLN (WRAP)
50.0000 ug | INTRAMUSCULAR | Status: DC | PRN
Start: 2016-07-16 — End: 2016-07-16
  Filled 2016-07-16: qty 2

## 2016-07-16 MED ORDER — HYDRALAZINE HCL 20 MG/ML IJ SOLN
10.0000 mg | Freq: Once | INTRAMUSCULAR | Status: DC | PRN
Start: 2016-07-16 — End: 2016-07-16

## 2016-07-16 MED ORDER — DSS 100 MG PO CAPS
100.0000 mg | ORAL_CAPSULE | Freq: Two times a day (BID) | ORAL | 0 refills | Status: AC
Start: 2016-07-16 — End: ?

## 2016-07-16 MED ORDER — HYDROMORPHONE HCL 0.5 MG/0.5 ML IJ SOLN
0.2000 mg | INTRAMUSCULAR | Status: DC | PRN
Start: 2016-07-16 — End: 2016-07-16

## 2016-07-16 MED ORDER — PHENYLEPHRINE HCL 10 MG/ML IV SOLN (WRAP)
Status: DC | PRN
Start: 2016-07-16 — End: 2016-07-16
  Administered 2016-07-16: 100 ug via INTRAVENOUS
  Administered 2016-07-16: 50 ug via INTRAVENOUS

## 2016-07-16 MED ORDER — OXYCODONE HCL 5 MG PO TABS
ORAL_TABLET | ORAL | Status: AC
Start: 2016-07-16 — End: ?
  Filled 2016-07-16: qty 1

## 2016-07-16 MED ORDER — ROCURONIUM BROMIDE 10 MG/ML IV SOLN (WRAP)
INTRAVENOUS | Status: DC | PRN
Start: 2016-07-16 — End: 2016-07-16
  Administered 2016-07-16: 10 mg via INTRAVENOUS
  Administered 2016-07-16: 40 mg via INTRAVENOUS

## 2016-07-16 MED ORDER — MIDAZOLAM HCL 2 MG/2ML IJ SOLN
INTRAMUSCULAR | Status: AC
Start: 2016-07-16 — End: ?
  Filled 2016-07-16: qty 2

## 2016-07-16 MED ORDER — FAMOTIDINE 20 MG/2ML IV SOLN
INTRAVENOUS | Status: AC
Start: 2016-07-16 — End: ?
  Filled 2016-07-16: qty 2

## 2016-07-16 MED ORDER — ENOXAPARIN SODIUM 40 MG/0.4ML SC SOLN
SUBCUTANEOUS | Status: AC
Start: 2016-07-16 — End: ?
  Filled 2016-07-16: qty 0.4

## 2016-07-16 MED ORDER — DOCUSATE SODIUM 100 MG PO CAPS
100.0000 mg | ORAL_CAPSULE | Freq: Two times a day (BID) | ORAL | Status: DC
Start: 2016-07-16 — End: 2016-07-17
  Administered 2016-07-16: 18:00:00 100 mg via ORAL
  Filled 2016-07-16: qty 1

## 2016-07-16 MED ORDER — BUPIVACAINE-EPINEPHRINE (PF) 0.25% -1:200000 IJ SOLN
INTRAMUSCULAR | Status: DC | PRN
Start: 2016-07-16 — End: 2016-07-16
  Administered 2016-07-16: 10 mL via INTRAMUSCULAR

## 2016-07-16 MED ORDER — OXYCODONE HCL 5 MG PO TABS
5.0000 mg | ORAL_TABLET | ORAL | Status: DC | PRN
Start: 2016-07-16 — End: 2016-07-16
  Administered 2016-07-16: 5 mg via ORAL

## 2016-07-16 MED ORDER — GLYCOPYRROLATE 0.2 MG/ML IJ SOLN
INTRAMUSCULAR | Status: AC
Start: 2016-07-16 — End: ?
  Filled 2016-07-16: qty 3

## 2016-07-16 MED ORDER — CEFAZOLIN SODIUM-DEXTROSE 2-3 GM-% IV SOLR
2.0000 g | INTRAVENOUS | Status: AC
Start: 2016-07-16 — End: 2016-07-16
  Administered 2016-07-16: 2 g via INTRAVENOUS

## 2016-07-16 MED ORDER — NEOSTIGMINE METHYLSULFATE 1 MG/ML IJ/IV SOLN (WRAP)
Status: DC | PRN
Start: 2016-07-16 — End: 2016-07-16
  Administered 2016-07-16: 3 mg via INTRAVENOUS

## 2016-07-16 MED ORDER — ALPRAZOLAM 0.25 MG PO TABS
0.2500 mg | ORAL_TABLET | Freq: Every day | ORAL | Status: DC
Start: 2016-07-17 — End: 2016-07-17

## 2016-07-16 MED ORDER — ACETAMINOPHEN 500 MG PO TABS
1000.0000 mg | ORAL_TABLET | Freq: Once | ORAL | Status: AC
Start: 2016-07-16 — End: 2016-07-16
  Administered 2016-07-16: 1000 mg via ORAL

## 2016-07-16 MED ORDER — FLUORESCEIN SODIUM 10 % IV SOLN
INTRAVENOUS | Status: AC
Start: 2016-07-16 — End: ?
  Filled 2016-07-16: qty 5

## 2016-07-16 MED ORDER — PROPOFOL 10 MG/ML IV EMUL (WRAP)
INTRAVENOUS | Status: AC
Start: 2016-07-16 — End: ?
  Filled 2016-07-16: qty 20

## 2016-07-16 MED ORDER — CEFAZOLIN SODIUM-DEXTROSE 2-3 GM-% IV SOLR
INTRAVENOUS | Status: AC
Start: 2016-07-16 — End: ?
  Filled 2016-07-16: qty 50

## 2016-07-16 MED ORDER — SODIUM CHLORIDE 0.9% BAG (IRRIGATION USE)
INTRAVENOUS | Status: DC | PRN
Start: 2016-07-16 — End: 2016-07-16
  Administered 2016-07-16: 1000 mL

## 2016-07-16 MED ORDER — ESMOLOL HCL 100 MG/10ML IV SOLN
INTRAVENOUS | Status: AC
Start: 2016-07-16 — End: ?
  Filled 2016-07-16: qty 10

## 2016-07-16 MED ORDER — ONDANSETRON HCL 4 MG/2ML IJ SOLN
INTRAMUSCULAR | Status: DC | PRN
Start: 2016-07-16 — End: 2016-07-16
  Administered 2016-07-16: 4 mg via INTRAVENOUS

## 2016-07-16 MED ORDER — FAMOTIDINE 10 MG/ML IV SOLN (WRAP)
INTRAVENOUS | Status: DC | PRN
Start: 2016-07-16 — End: 2016-07-16
  Administered 2016-07-16: 20 mg via INTRAVENOUS

## 2016-07-16 MED ORDER — ESMOLOL HCL 100 MG/10ML IV SOLN
INTRAVENOUS | Status: DC | PRN
Start: 2016-07-16 — End: 2016-07-16
  Administered 2016-07-16: 10 mg via INTRAVENOUS

## 2016-07-16 MED ORDER — ROCURONIUM BROMIDE 50 MG/5ML IV SOLN
INTRAVENOUS | Status: AC
Start: 2016-07-16 — End: ?
  Filled 2016-07-16: qty 5

## 2016-07-16 MED ORDER — TRAMADOL HCL 50 MG PO TABS
50.0000 mg | ORAL_TABLET | Freq: Four times a day (QID) | ORAL | Status: DC
Start: 2016-07-16 — End: 2016-07-17
  Administered 2016-07-16 – 2016-07-17 (×4): 50 mg via ORAL
  Filled 2016-07-16 (×4): qty 1

## 2016-07-16 MED ORDER — LIDOCAINE HCL 1 % IJ SOLN
INTRAMUSCULAR | Status: AC
Start: 2016-07-16 — End: ?
  Filled 2016-07-16: qty 10

## 2016-07-16 MED ORDER — GABAPENTIN 100 MG PO CAPS
100.0000 mg | ORAL_CAPSULE | Freq: Once | ORAL | Status: AC
Start: 2016-07-16 — End: 2016-07-16
  Administered 2016-07-16: 100 mg via ORAL

## 2016-07-16 MED ORDER — TRAMADOL HCL 50 MG PO TABS
50.0000 mg | ORAL_TABLET | Freq: Four times a day (QID) | ORAL | 0 refills | Status: AC | PRN
Start: 2016-07-16 — End: ?

## 2016-07-16 MED ORDER — PROMETHAZINE HCL 25 MG/ML IJ SOLN
6.2500 mg | Freq: Once | INTRAMUSCULAR | Status: DC | PRN
Start: 2016-07-16 — End: 2016-07-16

## 2016-07-16 SURGICAL SUPPLY — 63 items
APPLCATOR CHLORAPREP 26ML (Prep) ×3 IMPLANT
APPLICATOR ENDOSCOPIC L41 CM NONREFLECTIVE CANNULA CANNULATED STYLET (Hemostat) IMPLANT
APPLICATOR ESCP SS FLSL 5MM 41CM LF STRL (Hemostat)
BAND AID STERILE 1X3 (Dressing) ×9 IMPLANT
CANULA STABILITY 5MM (Procedure Accessories) ×9 IMPLANT
CATHETER SURGICAL OD6 FR COLPO-PNEUMO (Procedure Accessories) ×2
COVER HVYDTY BLK 65X90IN (Drape) ×3 IMPLANT
GLOVE SURG BIOGEL ORTHO SZ8 (Glove) ×9 IMPLANT
GOWN SRG XL SMARTGOWN LF STRL LVL 4 (Gown)
GOWN SURGICAL XL SMARTGOWN LEVEL 4 (Gown)
GOWN SURGICAL XL SMARTGOWN LEVEL 4 BREATHABLE (Gown) IMPLANT
IRRIGATOR SUCTN PUMP/HANDPIECE (Other) ×3 IMPLANT
KIT CLOSURE PROCEDURE (Suture) ×3 IMPLANT
MANIPULATOR UTERINE OD6 FR COLPO-PNEUMO OCCLUDER SILICONE (Procedure Accessories) ×2 IMPLANT
OCCLUDER COLPO PNEUMO (Procedure Accessories) ×1
PACK TLH ONCOLOGY (Pack) ×3 IMPLANT
PAD ELECTROSRG GRND REM W CRD (Procedure Accessories) ×3 IMPLANT
PAD SANITARY L12.25 IN X W4.25 IN HEAVY ABSORBENT MOISTURE BARRIER (Dressing) ×2 IMPLANT
PAD SNTR SLK FLF CRTY 12.25X4.25IN LF NS (Dressing) ×3
POUCH INSTRUMENT (Drape) ×3 IMPLANT
SCISSOR ENDOCUT DISP LAPO (Instrument) ×3 IMPLANT
SET IRR DEHP 10 GTT/ML STRG 81IN LF STRL (Tubing)
SET IRRIGATION L81 IN 10 GTT/ML STRAIGHT (Tubing)
SET IRRIGATION L81 IN 10 GTT/ML STRAIGHT NA DEHP BLADDER REGULATE (Tubing) IMPLANT
SHEAR CURVD ERGO HNDLE 36CM (Cautery) ×3 IMPLANT
SLIPCOVER LAP-HUG-U-VAC LAP-S (Sterilization Supply) ×3 IMPLANT
SOLUTION IRR 0.9% NACL 1000ML LF STRL (Irrigation Solutions) ×1
SOLUTION IRRIGATION 0.9% SODIUM CHLORIDE (Irrigation Solutions) ×2
SOLUTION IRRIGATION 0.9% SODIUM CHLORIDE 1000 ML PLASTIC POUR BOTTLE (Irrigation Solutions) ×2 IMPLANT
SOLUTION IV 0.9% NACL 1000ML VFLX LF PLS (IV Solutions) ×1
SOLUTION IV 0.9% SODIUM CHLORIDE PVC (IV Solutions) ×2
SOLUTION IV 0.9% SODIUM CHLORIDE PVC 1000 ML PH 5 PLASTIC CONTAINER (IV Solutions) ×2 IMPLANT
STRIP SKIN CLOSURE L4 IN X W1/2 IN (Dressing) ×2
STRIP SKIN CLOSURE L4 IN X W1/2 IN REINFORCE STERI-STRIP POLYESTER (Dressing) ×2 IMPLANT
STRIP SKNCLS PLSTR STRSTRP 4X.5IN LF (Dressing) ×1
SUTURE MONOCRYL 3-0 PS2 27IN (Suture) ×6 IMPLANT
SUTURE VICRYL 0 CT1 8X27IN (Suture) ×3 IMPLANT
SUTURE VICRYL 0 UR6 27IN (Suture) IMPLANT
SUTURE VICRYL 0-0 CT1 (Suture) ×3 IMPLANT
SUTURE VICRYL 012X18IN (Suture) IMPLANT
SYRINGE 50 ML GRADUATE NONPYROGENIC DEHP (Syringes, Needles) ×2
SYRINGE 50 ML GRADUATE NONPYROGENIC DEHP FREE PVC FREE LOK MEDICAL (Syringes, Needles) ×2 IMPLANT
SYRINGE MED 50ML LL LF STRL GRAD N-PYRG (Syringes, Needles) ×1
SYSTEM IMAGING 8X6IN CLEARIFY MICROFIBER WARM HUB TRCR WIPE DSPSBL (Kits) ×2 IMPLANT
SYSTEM IMG MRFBR CLEARIFY 8X6IN WRM HUB (Kits) ×3
TIP MANIPULATOR RUMI II OD5.1 MM (Procedure Accessories)
TIP MANIPULATOR RUMI II OD5.1 MM FLEXIBLE UTERINE L6CM LAVANDER (Procedure Accessories) IMPLANT
TIP MANIPULATOR RUMI II OD6.7 MM (Procedure Accessories)
TIP MANIPULATOR RUMI II OD6.7 MM FLEXIBLE UTERINE L10 CM GREEN (Procedure Accessories) IMPLANT
TIP MANIPULATOR RUMI II OD6.7 MM FLEXIBLE UTERINE L8 CM BLUE (Procedure Accessories) IMPLANT
TIP MANIPULATOR RUMI II OD6.7 MM UTERINE (Procedure Accessories)
TIP MANIPULATOR RUMI II OD6.7 MM UTERINE L6 CM WHITE (Procedure Accessories) IMPLANT
TIP RUMI 6.7MM X 10CM (Procedure Accessories)
TIP RUMI 6.7MM X 6CM (Procedure Accessories)
TIP RUMI 6.7MM X 8CM (Procedure Accessories)
TIP RUMI II INTUTRN 5.1MMX6CM (Procedure Accessories)
TOWEL STERILE REUSABLE 8PK (Procedure Accessories) ×3 IMPLANT
TRAY SSTEP CATH UMETER 16FR (Tray) ×3 IMPLANT
TROCAR BLADELESS ENDO 5X100MM (Laparoscopy Supplies) ×9 IMPLANT
TROCAR ENDO BLADELESS 11X100MM (Laparoscopy Supplies) IMPLANT
TUBE SET DISP HIGH FLOW (Tubing) ×1
TUBING INSUFFLATION SET HIGH FLOW (Tubing) ×2
TUBING INSUFFLATION SET HIGH FLOW TOUCHSCREEN PNEUMOSURE THERMOPLASTIC (Tubing) ×2 IMPLANT

## 2016-07-16 NOTE — Transfer of Care (Signed)
Anesthesia Transfer of Care Note    Patient: AMANA BOUSKA    Procedures performed: Procedure(s):  LAPAROSCOPIC, HYSTERECTOMY, TOTAL, BS  CYSTOSCOPY    Anesthesia type: General ETT    Patient location:Phase I PACU    Last vitals:   Vitals:    07/16/16 1159   BP: 114/70   Pulse: 83   Resp: 15   Temp: 36.8 C (98.2 F)   SpO2: 100%       Post pain: Patient not complaining of pain, continue current therapy      Mental Status:awake and alert     Respiratory Function: tolerating face mask    Cardiovascular: stable    Nausea/Vomiting: patient not complaining of nausea or vomiting    Hydration Status: adequate    Post assessment: no apparent anesthetic complications, no reportable events and no evidence of recall    Signed by: Rubye Oaks  07/16/16 12:00 PM

## 2016-07-16 NOTE — Progress Notes (Addendum)
GYN ONC Post-Operative Check  Team Contact information      Name Number  Hours    1st Georgina Pillion ONC NP [facility] [phone] or [phone]  M- 11-9  Tues-thr 7-9   Fri 7-5   2nd CALL Resident [facility] [phone]  [phone] everyday   3rd CALL GYN ONC Fellow [facility] [phone]  [phone] weekdays    After HOURS     1st CALL   [facility] [phone] weeknights)   OR call [phone] weekends after hours     2nd CALL   Page [phone] for the MD on call      Date: 07/16/2016     Hospital day 0    Procedure(s):  LAPAROSCOPIC, HYSTERECTOMY, TOTAL, BS  CYSTOSCOPY    Day of Surgery  -------------------       Subjective:  Pt doing well post-operatively. Pain controlled with medication reports pain 4/10. Tolerating sips without n/v. Able to ambulate with assistance. Foley in place, fill and pull ordered.     Objective:     Temp:  [97.7 F (36.5 C)-98.4 F (36.9 C)] 97.9 F (36.6 C)  Heart Rate:  [70-91] 91  Resp Rate:  [15-17] 16  BP: (114-122)/(70-87) 122/81    No intake/output data recorded.  I/O this shift:  In: 1600 [I.V.:1600]  Out: 475 [Urine:425; Blood:50]        EXAM:  Gen: NAD     CV: RRR    Pulm: CTAB    Abd: Soft, appropriately tender    Incision: C/d/i    Ext: No calf tenderness, no edema       Scheduled Meds:  Current Facility-Administered Medications   Medication Dose Route Frequency   . acetaminophen  650 mg Oral 4 times per day   . [START ON 07/17/2016] ALPRAZolam  0.25 mg Oral Daily   . docusate sodium  100 mg Oral BID   . traMADol  50 mg Oral Q6H     Continuous Infusions:  . sodium chloride       PRN Meds:.ondansetron **OR** ondansetron, oxyCODONE **OR** oxyCODONE, prochlorperazine, simethicone    Laboratory Results:  Results     Procedure Component Value Units Date/Time    POCT Pregnancy Test, Urine HCG [[id number]] Collected:  07/16/16 0830     Updated:  07/16/16 0900     POCT QC Pass     POCT Pregnancy HCG Test, UR Negative     Comment: Negative Value is Normal in Healthy Males or Healthy  non-pregnant Females        Radiology Results:  Radiology Results (24 Hour)     ** No results found for the last 24 hours. **          Assessment:     36 y.o. POD#0 s/p LAPAROSCOPIC, HYSTERECTOMY, TOTAL, BS  CYSTOSCOPY    Plan:    Neuro: Tylenol/Tramadol ATC, Oxycodone PRN   Hx anxiety/depression, c/w daily xanax tomorrow   Hold home trazodone for now     CV: Hx hypertension, hold home norvasc/HCTZ pending AM BMP     Pulm: No acute issues, encourage IS/OOB    GI/FEN:   Diet: Regular   IVF: NS @ 100 cc/hr   Daily labs: CBC, BMP   Anti-emetics: Zofran PRN N/V   Bowel regimen: Colace   Hx IBS    GU: Foley in place, fill and pull ordered   Baseline cr 0.7    Heme: Pre-op h/h 13.2/38.5, CBC in AM     ID: No acute issues  Endo: No acute issues     Dispo: Pending meeting post-op milestones     DVT Prophylaxis: SCDs  Antibiotics Indicated:  No  Foley Cath Removed:  No, ordered to be removed today.  Beta Blockers Indicated:  No    Erika Sergeant, NP

## 2016-07-16 NOTE — Progress Notes (Signed)
Ambulated in halls without problems. Foley Goree'ed as ordered after fill and pull. Voided well. Will cont to assess.

## 2016-07-16 NOTE — Brief Op Note (Signed)
BRIEF OP NOTE    Date Time: 07/16/16 12:02 PM    Patient Name:   Erika Mccarty    Date of Operation:   07/16/2016    Providers Performing:   Surgeon(s):  Zackery Barefoot, MD  Doristine Mango, DO    Assistant (s):   Circulator: Delana Meyer, RN  Relief Circulator: Stann Ore, RN  Scrub Person: Jacelyn Pi  Second Circulator: Avel Sensor, RN    Operative Procedure:   Procedure(s):  LAPAROSCOPIC, HYSTERECTOMY, TOTAL, BS  CYSTOSCOPY    Preoperative Diagnosis:   Pre-Op Diagnosis Codes:     * Endometrial hyperplasia with atypia [N85.02]    Postoperative Diagnosis:   Post-Op Diagnosis Codes:     * Endometrial hyperplasia with atypia [N85.02]    Anesthesia:   Choice    Estimated Blood Loss:    50 mL    Implants:   * No implants in log *    Drains:   Drains: Yes, foley    Specimens:        SPECIMENS (last 24 hours)      Pathology Specimens     Row Name 07/16/16 1100                Additional Information    Send final report to: --   Dr. Maple Hudson          Specimen Information    Specimen Testing Required Routine Pathology;Frozen Section       Specimen ID  [id number]       Specimen Description Uterus, cervix and bilaterla tubes           Findings:   Normal appearing uterus, bilateral fallopian tubes and ovaries. Uterus sounded to 7cm,  Cystoscopy showing strong bilateral ureteral jets.  Frozen pathology with benign findings.    Complications:   None      Signed by: Doristine Mango, DO, PGY3                                                                           [facility]

## 2016-07-16 NOTE — Anesthesia Postprocedure Evaluation (Signed)
Anesthesia Post Evaluation    Patient: Erika Mccarty    Procedure(s):  LAPAROSCOPIC, HYSTERECTOMY, TOTAL, BS  CYSTOSCOPY    Anesthesia type: General ETT    Last Vitals:   Vitals:    07/16/16 1250   BP: 118/79   Pulse: 70   Resp: 16   Temp:    SpO2: 100%       Patient Location: Phase I PACU      Post Pain: Patient not complaining of pain, continue current therapy    Mental Status: awake    Respiratory Function: tolerating room air    Cardiovascular: stable    Nausea/Vomiting: patient not complaining of nausea or vomiting    Hydration Status: adequate    Post Assessment: no apparent anesthetic complications          Anesthesia Qualified Clinical Data Registry 2018    PACU Reintubation  Did the Patient have general anesthesia with intubation: Yes  Did the Patient require reintubation in the PACU?: No  Was this a planned exubation trial (documented in the medical record)?: No    PONV Adult  Is the patient aged 36 or older: Yes  Did the patient receive recieve a general anesthestic: Yes  Does the patient have 3 or more risk factors for PONV? No        PONV Pediatric  Is the patient aged 17-17? No            PACU Transfer Checklist Protocol  Was the patient transferred to the PACU at the conclusion of surgery? Yes  Was a checklist or transfer protocol used? Yes    ICU Transfer Checklist Protocol  Was the patient transferred to the ICU at the conclusion of surgery? No      Post-op Pain Assessment Prior to Anesthesia Care End  Age >=18 and assessed for pain in PACU: Yes  Pacu pain score <7/10: Yes      Perioperative Mortality  Perioperative mortality prior to Anesthesia end time: No    Perioperative Cardiac Arrest  Did the patient have an unanticipated intraoperative cardiac arrest between anesthesia start time and anesthesia end time? No    Unplanned Admission to ICU  Did the patient have an unplanned admission to the ICU (not initially anticipated at anesthesia start time)? No      Signed by: Teryl Lucy,  07/16/2016 1:04 PM

## 2016-07-16 NOTE — Anesthesia Preprocedure Evaluation (Signed)
Anesthesia Evaluation    AIRWAY    Mallampati: II    TM distance: >3 FB  Neck ROM: full  Mouth Opening:full   CARDIOVASCULAR           DENTAL         PULMONARY         OTHER FINDINGS              Relevant Problems   No active problems are marked relevant to this note.               Anesthesia Plan    ASA 2     general                                                    Signed by: Teryl Lucy 07/16/16 8:28 AM

## 2016-07-16 NOTE — Discharge Instr - AVS First Page (Signed)
[facility]   [address], [address]   [place], [place] [address]   Phone: [phone]   Fax: [phone]        Discharge Instructions for Gynecologic Surgery  You had gynecologic surgery. This sheet contains information about what you can and can't do after your surgery. Remember, you need to take it easy.    Activity   Limit your activity for 4-6 weeks.   Don't lift anything heavier than 10-20 pounds.   Avoid strenuous activities, such as mowing the lawn, vacuuming, lifting laundry basket, or playing sports (no high impact or core exercises for 6 weeks)   Initiate your activity with short, slow walks. Gradually increase your pace and distance as you feel able. No limit on distance.    Listen to your body. If an activity causes pain, stop.   Don't drive for while requiring narcotic pain medicine or having significant discomfort. You may ride in a car for short trips.   Rest when you are tired.    Don't have sexual intercourse or use tampons or douches until your doctor says it's safe to do so (usually at least 6 weeks after surgery)   No soaking in bathtub or swimming until cleared by your doctor    Home Care   Always keep your incision clean and dry.   Shower as needed. Wash your incision gently with mild soap and warm water and pat dry. Do not put lotions or ointments on these areas.   Use pain medication as prescribed   Use tylenol as needed    Check your temperature every day for 1 week(s) after your surgery.   Return to your diet as you feel able. Eat a healthy, well-balanced diet.    It is normal to have vaginal bleeding after this surgery (like a light period or spotting) for up to two weeks. You may then have bleeding again at 6 weeks when the sutures dissolve. You can wear a panty liner during this period (unscented). As the sutures dissolve, it is normal to have a watery yellow discharge with a musty odor. It is NOT normal to soak a pad an hour with  blood, or to have a very foul smelling discharge. If you experience this, you need to be evaluated immediately.    Do not drink alcohol while taking narcotics.    You may put an ice pack on your perineal area three times a day (wrapped in a towel, not directly on skin) the first week after surgery to help alleviate pain and swelling.     Avoid constipation.   Use laxatives or stool softeners as directed by your doctor. ( Some options include Colace, Miralax, Senna S or Pericolace)   Eat more high-fiber foods.   Drink 6-8 glasses of water every day, unless directed otherwise.    Follow-Up  Make a follow-up appointment as directed by our staff.    When to Call Your Doctor  Call your doctor right away if you have any of the following:   Fever above 101F or chills   Bright red vaginal bleeding or a smelly discharge   Vaginal bleeding that soaks more than one sanitary pad per hour   Trouble urinating or burning sensation when you urinate   Severe abdominal pain or bloating   Redness, swelling, or drainage at your incision site   Shortness of breath   Vomiting   Severe constipation (Call if no flatus or BM in 72 hours   after discharge from hospital)       Wishing you a Safe and Speedy recovery!!!!

## 2016-07-16 NOTE — Plan of Care (Signed)
Transferred to room from PACU via stretcher, A&O x 4, ambulated to the bed from the stretcher with assistance. Oriented to her surroundings and POC discussed with pt. Pt reported understanding. IV infusing well, foley in place, peri care done. lap sites x 3 CDI. k pad applied to abd for comfort. SCD on bilateral LE. Informed to call for assistance when ready to get up to prevent from the fall. Will con to monitor pain level and assist with ambulating in halls.

## 2016-07-17 ENCOUNTER — Encounter (HOSPITAL_BASED_OUTPATIENT_CLINIC_OR_DEPARTMENT_OTHER): Payer: Self-pay | Admitting: Gynecologic Oncology

## 2016-07-17 DIAGNOSIS — N8502 Endometrial intraepithelial neoplasia [EIN]: Secondary | ICD-10-CM

## 2016-07-17 LAB — CBC
Absolute NRBC: 0 10*3/uL
Hematocrit: 32 % — ABNORMAL LOW (ref 37.0–47.0)
Hgb: 11.3 g/dL — ABNORMAL LOW (ref 12.0–16.0)
MCH: 31.2 pg (ref 28.0–32.0)
MCHC: 35.3 g/dL (ref 32.0–36.0)
MCV: 88.4 fL (ref 80.0–100.0)
MPV: 12.2 fL (ref 9.4–12.3)
Nucleated RBC: 0 /100 WBC (ref 0.0–1.0)
Platelets: 222 10*3/uL (ref 140–400)
RBC: 3.62 10*6/uL — ABNORMAL LOW (ref 4.20–5.40)
RDW: 12 % (ref 12–15)
WBC: 7.86 10*3/uL (ref 3.50–10.80)

## 2016-07-17 LAB — BASIC METABOLIC PANEL
BUN: 6 mg/dL — ABNORMAL LOW (ref 7.0–19.0)
CO2: 22 mEq/L (ref 22–29)
Calcium: 8.8 mg/dL (ref 8.5–10.5)
Chloride: 107 mEq/L (ref 100–111)
Creatinine: 0.7 mg/dL (ref 0.6–1.0)
Glucose: 93 mg/dL (ref 70–100)
Potassium: 4 mEq/L (ref 3.5–5.1)
Sodium: 137 mEq/L (ref 136–145)

## 2016-07-17 LAB — GFR: EGFR: >60

## 2016-07-17 MED ORDER — SIMETHICONE 80 MG PO CHEW
80.0000 mg | CHEWABLE_TABLET | Freq: Four times a day (QID) | ORAL | Status: DC | PRN
Start: 2016-07-17 — End: 2016-07-17

## 2016-07-17 NOTE — Plan of Care (Signed)
Pt voiding spontaneously, tolerating diet, oob independently. Pt watched discharge video. Saline lock dced. Discharge instructions given to pt. Verbalized understanding

## 2016-07-17 NOTE — Final Progress Note (DC Note for stay less than 48 (Signed)
GYN ONC Team Daily Progress Note  Team Contact information      Name Number  Hours    1st Georgina Pillion ONC NP [facility] [phone]   Or [id number] M- 7-5, Tues-thr 7-9, Fri 7-5     2nd CALL Resident [facility] [phone]  [phone] everyday   3rd CALL GYN ONC Fellow [facility] [phone]  [phone] weekdays    After HOURS     1st CALL   [facility] [phone] ([phone] weeknights)   OR call [phone] ([phone] weekends after hours     2nd CALL   Page [phone] for the MD on call        Date: 07/17/2016     Hospital day 1    Procedure(s):  LAPAROSCOPIC, HYSTERECTOMY, TOTAL, BS  CYSTOSCOPY    1 Day Post-Op  -------------------     Subjective:  Doing well this AM. Pain well controlled with PO meds. Reporting mild gas pain. Ambulating. Tolerating regular diet with no nausea/vomiting. Passed fill & pull voiding trial- voiding independently.     Objective:     Temp:  [97.7 F (36.5 C)-98.6 F (37 C)] 98.6 F (37 C)  Heart Rate:  [70-104] 85  Resp Rate:  [15-20] 20  BP: (103-130)/(66-87) 103/66    I/O last 3 completed shifts:  In: 1600 [I.V.:1600]  Out: 2695 [Urine:2645; Blood:50]  I/O this shift:  In: -   Out: 900 [Urine:900]        EXAM:  Gen: NAD                                  CV: RRR                          Pulm: CTAB                          Abd: Soft, appropriately tender                          Incision: C/d/i                          Ext: No calf tenderness, no edema       Scheduled Meds:  Current Facility-Administered Medications   Medication Dose Route Frequency   . acetaminophen  650 mg Oral 4 times per day   . ALPRAZolam  0.25 mg Oral Daily   . docusate sodium  100 mg Oral BID   . traMADol  50 mg Oral Q6H     Continuous Infusions:  . sodium chloride       PRN Meds:.ondansetron **OR** ondansetron, oxyCODONE **OR** oxyCODONE, prochlorperazine, simethicone    Laboratory Results:  Results     Procedure Component Value Units Date/Time    Basic Metabolic Panel [[id number]]  (Abnormal) Collected:  07/17/16 0356    Specimen:  Blood  Updated:  07/17/16 0521     Glucose 93 mg/dL      BUN 6.0 (L) mg/dL      Creatinine 0.7 mg/dL      Calcium 8.8 mg/dL      Sodium 875 mEq/L      Potassium 4.0 mEq/L      Chloride 107 mEq/L      CO2 22 mEq/L     GFR [[id number]] Collected:  07/17/16 0356  Updated:  07/17/16 0521     EGFR >60.0    CBC without differential [[id number]]  (Abnormal) Collected:  07/17/16 0356    Specimen:  Blood from Blood Updated:  07/17/16 0502     WBC 7.86 x10 3/uL      Hgb 11.3 (L) g/dL      Hematocrit 40.9 (L) %      Platelets 222 x10 3/uL      RBC 3.62 (L) x10 6/uL      MCV 88.4 fL      MCH 31.2 pg      MCHC 35.3 g/dL      RDW 12 %      MPV 12.2 fL      Nucleated RBC 0.0 /100 WBC      Absolute NRBC 0.00 x10 3/uL     POCT Pregnancy Test, Urine HCG [[id number]] Collected:  07/16/16 0830     Updated:  07/16/16 0900     POCT QC Pass     POCT Pregnancy HCG Test, UR Negative     Comment: Negative Value is Normal in Healthy Males or Healthy non-pregnant Females        Radiology Results:  Radiology Results (24 Hour)     ** No results found for the last 24 hours. **          Assessment:     36 y.o. POD#1 s/p TLH, BS, cystoscopy   Indication: endometrial hyperplasia with atypia  EBL: 50 mL     Plan:    Neuro: Tylenol/Tramadol ATC, Oxycodone PRN              Hx anxiety/depression, c/w daily xanax tomorrow              Hold home trazodone for now      CV: Hx hypertension, hold home norvasc/HCTZ pending AM BMP     Pulm: No acute issues, encourage IS/OOB    GI/FEN:              Diet: Regular              IVF: NS @ 100 cc/hr --> SLIV              Daily labs: CBC, BMP              Anti-emetics: Zofran PRN N/V              Bowel regimen: Colace              Hx IBS    GU:  Passed fill & pull voiding trial, voiding indepedently   Good UOP (~95ml/hr)              Baseline cr 0.7 --> stable POD#1 at 0.7    Heme: Pre-op h/h 13.2/38.5 --> 11.3/32 on POD#1    ID: No acute issues     Endo: No acute issues     Dispo: Anticipate [place] home today    DVT  Prophylaxis: SCDs  Antibiotics Indicated:  No  Foley Cath Removed:  No, ordered to be removed today.  Beta Blockers Indicated:  No    Doristine Mango, DO, PGY3

## 2016-07-17 NOTE — Plan of Care (Signed)
Problem: Safety  Goal: Patient will be free from injury during hospitalization  Outcome: Progressing   07/17/16 0450   Goal/Interventions addressed this shift   Patient will be free from injury during hospitalization  Assess patient's risk for falls and implement fall prevention plan of care per policy;Provide and maintain safe environment;Use appropriate transfer methods;Ensure appropriate safety devices are available at the bedside;Include patient/ family/ care giver in decisions related to safety;Hourly rounding;Assess for patients risk for elopement and implement Elopement Risk Plan per policy;Provide alternative method of communication if needed (communication boards, writing)     Goal: Patient will be free from infection during hospitalization  Outcome: Progressing   07/17/16 0450   Goal/Interventions addressed this shift   Free from Infection during hospitalization Assess and monitor for signs and symptoms of infection;Monitor lab/diagnostic results;Monitor all insertion sites (i.e. indwelling lines, tubes, urinary catheters, and drains);Encourage patient and family to use good hand hygiene technique       Problem: Pain  Goal: Pain at adequate level as identified by patient  Outcome: Progressing   07/17/16 0450   Goal/Interventions addressed this shift   Pain at adequate level as identified by patient Identify patient comfort function goal;Assess for risk of opioid induced respiratory depression, including snoring/sleep apnea. Alert healthcare team of risk factors identified.;Assess pain on admission, during daily assessment and/or before any "as needed" intervention(s);Reassess pain within 30-60 minutes of any procedure/intervention, per Pain Assessment, Intervention, Reassessment (AIR) Cycle;Evaluate if patient comfort function goal is met;Offer non-pharmacological pain management interventions;Include patient/patient care companion in decisions related to pain management as needed;Evaluate patient's  satisfaction with pain management progress       Comments: Patient A&O x 4 and progressing. VSS. Pain 2/10 to abdomen. Patient ambulating in hallway independently without complication. Pain managed by scheduled Tylenol + Tramadol and PRN Simethicone. Patient voiding adequately, flatus -, BM -. Regular diet tolerated. Patient's pain goal <3/10. Pain increases with movement. Abdominal binder, Heating pad in place to abdomen. Splinting encouraged during coughing and movement. Repositioned patient for comfort. Will continue to assess and manage pain as needed. Patient assisted with repositioning, rest, emotional support and distraction used to assist in managing pain.

## 2016-07-17 NOTE — UM Notes (Signed)
Ambulatory Status.    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

## 2016-07-20 LAB — LAB USE ONLY - HISTORICAL SURGICAL PATHOLOGY

## 2016-07-20 NOTE — Op Note (Signed)
Procedure Date: 07/16/2016     Patient Type: A     SURGEON: Zackery Barefoot MD  ASSISTANT:       PREOPERATIVE DIAGNOSIS:  Atypical endometrial hyperplasia.     POSTOPERATIVE DIAGNOSIS:  Atypical endometrial hyperplasia.     TITLES OF PROCEDURES:  1.  Total laparoscopic hysterectomy.  2.  Bilateral salpingectomy.  3.  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  4.  Cystoscopy.     ANESTHETIC:  General.     DRAINS:  Foley to gravity.     ESTIMATED BLOOD LOSS:  200 mL.     DESCRIPTION OF PROCEDURE:  After the risks, benefits, indications, and alternatives of the procedure  were reviewed with the patient and informed consent obtained, she was taken  to the operating room where after adequate anesthesia was obtained, she was  prepped and draped in the usual sterile fashion in the low lithotomy  position for abdominal pelvic surgery.  A Foley catheter was inserted as  was a KOH uterine manipulator.  Then, after an orogastric tube was placed  by anesthesia, a 5-mm periumbilical incision was made; and a 5-mm Ethicon  Optiview port placed under direct visualization and a pneumoperitoneum  obtained.  Two lateral 5-mm ports were placed in the right and left lower  quadrants respectively under direct visualization after the  pneumoperitoneum was obtained.  A thorough review of the abdomen and pelvis  was performed.  No evidence of upper abdominal pathology was appreciated.   In the posterior cul-de-sac in the pelvis, what appeared to be  endometriosis was noted.  The adhesions were taken down sharply and  ablated.  The foci of powder burn implants along the uterosacral were then  ablated.  No other obvious pathology was appreciated with normal-appearing  uterus and adnexal structures now.  The round ligaments were then  transected bilaterally with the Harmonic scalpel.  The anterior and  posterior leaves of the broad ligament were incised anteriorly across  midline and posteriorly and parallel to the infundibulopelvic  ligaments.   The bladder was taken down sharply off the lower uterine segment, cervix,  and proximal vagina using EndoShears.  With the ureters under direct  visualization, the tubes were separated from the ovaries using the Harmonic  scalpel.  Then, the uteroovarian ligaments were transected bilaterally with  the Harmonic scalpel.  The uterine arteries were transected bilaterally  with the Harmonic scalpel.  The cardinal and uterosacral ligaments were  transected bilaterally with the Harmonic scalpel.  The uterus, cervix, and  tubes were now amputated along the KOH uterine manipulator; and the  specimen delivered vaginally.  The vaginal cuff was closed using  interrupted sutures of 0 Vicryl.  Cystoscopy confirmed bladder and ureteral  integrity.  With hemostasis assured at all dissection sites, frozen section  did not demonstrate evidence of carcinoma; and as such, the ovaries were  left in situ in accord with the patient's desires.  With hemostasis assured  at all dissection sites, all instruments and sponges were removed.  The  pneumoperitoneum evacuated, the ports removed under direct visualization,  and the skin closed in a subcuticular manner.  Sponge, lap and needle  counts were verified correct.  She was escorted awake and in stable  condition, escorted by anesthesia to the recovery room.           D:  07/20/2016 06:12 AM by Dr. Zackery Barefoot, MD ([id number])  T:  07/20/2016 06:26 AM by [id number]      (  Conf: [id number]) (Doc ID: [id number])

## 2018-02-18 DIAGNOSIS — F1220 Cannabis dependence, uncomplicated: Secondary | ICD-10-CM | POA: Insufficient documentation

## 2018-02-18 DIAGNOSIS — I10 Essential (primary) hypertension: Secondary | ICD-10-CM | POA: Insufficient documentation

## 2018-02-18 DIAGNOSIS — F429 Obsessive-compulsive disorder, unspecified: Secondary | ICD-10-CM | POA: Insufficient documentation

## 2018-02-24 DIAGNOSIS — R000 Tachycardia, unspecified: Secondary | ICD-10-CM | POA: Insufficient documentation

## 2018-04-20 DIAGNOSIS — F319 Bipolar disorder, unspecified: Secondary | ICD-10-CM | POA: Insufficient documentation

## 2018-06-06 DIAGNOSIS — F3112 Bipolar disorder, current episode manic without psychotic features, moderate: Secondary | ICD-10-CM | POA: Insufficient documentation

## 2018-06-06 DIAGNOSIS — E785 Hyperlipidemia, unspecified: Secondary | ICD-10-CM | POA: Insufficient documentation

## 2018-06-07 ENCOUNTER — Other Ambulatory Visit: Payer: Self-pay | Admitting: Physician Assistant

## 2018-06-07 DIAGNOSIS — Z1231 Encounter for screening mammogram for malignant neoplasm of breast: Secondary | ICD-10-CM

## 2019-02-15 DIAGNOSIS — Z2821 Immunization not carried out because of patient refusal: Secondary | ICD-10-CM | POA: Insufficient documentation

## 2020-05-15 ENCOUNTER — Encounter: Payer: Self-pay | Admitting: Obstetrics and Gynecology

## 2020-05-15 ENCOUNTER — Ambulatory Visit (INDEPENDENT_AMBULATORY_CARE_PROVIDER_SITE_OTHER): Payer: BC Managed Care – PPO | Admitting: Obstetrics and Gynecology

## 2020-05-15 ENCOUNTER — Other Ambulatory Visit: Payer: Self-pay

## 2020-05-15 ENCOUNTER — Other Ambulatory Visit (HOSPITAL_COMMUNITY)
Admission: RE | Admit: 2020-05-15 | Discharge: 2020-05-15 | Disposition: A | Discharge status: Home / Self Care | Payer: BC Managed Care – PPO | Source: Ambulatory Visit | Attending: Obstetrics and Gynecology | Admitting: Obstetrics and Gynecology

## 2020-05-15 VITALS — BP 131/85 | HR 76 | Ht 62.0 in | Wt 181.5 lb

## 2020-05-15 DIAGNOSIS — N898 Other specified noninflammatory disorders of vagina: Secondary | ICD-10-CM | POA: Diagnosis present

## 2020-05-15 DIAGNOSIS — B3731 Acute candidiasis of vulva and vagina: Secondary | ICD-10-CM

## 2020-05-15 DIAGNOSIS — B373 Candidiasis of vulva and vagina: Secondary | ICD-10-CM

## 2020-05-15 MED ORDER — FLUCONAZOLE 150 MG PO TABS: 150.0000 mg | ORAL_TABLET | ORAL | 0 refills | Status: DC

## 2020-05-15 NOTE — Progress Notes (Signed)
HPI:      [name] is a 40 y.o. No obstetric history on file. who LMP was No LMP recorded. Patient has had a hysterectomy.  Subjective:   She presents today complaining of a 1 month history of vulvar/mons itching.  She states that she has tried numerous things which include topical use of antifungal cream, topical steroid cream, shaving the area-all without success.  She does report a new sexual partner in the last few months.  She states that she is not having any vaginal discharge or vaginal symptoms.  Symptoms seem confined to labia and mons. Patient states that she is not a diabetic. She has a hysterectomy.    Hx: The following portions of the patient's history were reviewed and updated as appropriate:             She  has a past medical history of Hypertension. She does not have a problem list on file. She  has a past surgical history that includes Abdominal hysterectomy. Her family history is not on file. She  reports that she has never smoked. She has never used smokeless tobacco. She reports previous alcohol use. She reports previous drug use. She has a current medication list which includes the following prescription(s): alprazolam, amlodipine, aripiprazole, fluconazole, fluvoxamine, and tretinoin. She has No Known Allergies.       Review of Systems:  Review of Systems  Constitutional: Denied constitutional symptoms, night sweats, recent illness, fatigue, fever, insomnia and weight loss.  Eyes: Denied eye symptoms, eye pain, photophobia, vision change and visual disturbance.  Ears/Nose/Throat/Neck: Denied ear, nose, throat or neck symptoms, hearing loss, nasal discharge, sinus congestion and sore throat.  Cardiovascular: Denied cardiovascular symptoms, arrhythmia, chest pain/pressure, edema, exercise intolerance, orthopnea and palpitations.  Respiratory: Denied pulmonary symptoms, asthma, pleuritic pain, productive sputum, cough, dyspnea and wheezing.  Gastrointestinal:  Denied, gastro-esophageal reflux, melena, nausea and vomiting.  Genitourinary: See HPI for additional information.  Musculoskeletal: Denied musculoskeletal symptoms, stiffness, swelling, muscle weakness and myalgia.  Dermatologic: Denied dermatology symptoms, rash and scar.  Neurologic: Denied neurology symptoms, dizziness, headache, neck pain and syncope.  Psychiatric: Denied psychiatric symptoms, anxiety and depression.  Endocrine: Denied endocrine symptoms including hot flashes and night sweats.   Meds:   Current Outpatient Medications on File Prior to Visit  Medication Sig Dispense Refill  . ALPRAZolam (XANAX) 0.25 MG tablet Take 0.25 mg by mouth 3 (three) times daily as needed.    Marland Kitchen amLODipine (NORVASC) 10 MG tablet Take by mouth.    . ARIPiprazole (ABILIFY) 10 MG tablet     . fluvoxaMINE (LUVOX) 100 MG tablet Take by mouth.    . tretinoin (RETIN-A) 0.1 % cream Apply topically.     No current facility-administered medications on file prior to visit.          Objective:     Vitals:   05/15/20 0743  BP: 131/85  Pulse: 76   Filed Weights   05/15/20 0743  Weight: 181 lb 8 oz (82.3 kg)              Physical examination   Pelvic:  Vulva: Normal appearance.  No lesions.  Some evidence of excoriation and skin sloughing on both labia.  Minimal erythema.  Mons appears completely normal.  No burrows tracts or excoriation noted.  Vagina: No lesions or abnormalities noted.  Support: Normal pelvic support.  Urethra No masses tenderness or scarring.  Meatus Normal size without lesions or prolapse.  Cervix:  Surgically absent  Anus: Normal exam.  No lesions.  Perineum: Normal exam.  No lesions.     Assessment:    No obstetric history on file. There are no problems to display for this patient.    1. Itching in the vaginal area   2. Monilial vulvovaginitis     Most likely diagnosis based on appearance and patient symptoms is monilia infection.   Plan:             1.  Nuswab performed to try to confirm a diagnosis  2.  Presumptive treatment with 2 weeks of Diflucan.  Patient to inform us if she gets better or if she continues to have symptoms. Orders No orders of the defined types were placed in this encounter.    Meds ordered this encounter  Medications  . fluconazole (DIFLUCAN) 150 MG tablet    Sig: Take 1 tablet (150 mg total) by mouth every 3 (three) days. For three doses    Dispense:  3 tablet    Refill:  0      F/U  Return for Annual Physical. I spent 31 minutes involved in the care of this patient preparing to see the patient by obtaining and reviewing her medical history (including labs, imaging tests and prior procedures), documenting clinical information in the electronic health record (EHR), counseling and coordinating care plans, writing and sending prescriptions, ordering tests or procedures and directly communicating with the patient by discussing pertinent items from her history and physical exam as well as detailing my assessment and plan as noted above so that she has an informed understanding.  All of her questions were answered.  Elonda Husky, M.D. 05/15/2020 8:07 AM

## 2020-05-16 LAB — CERVICOVAGINAL ANCILLARY ONLY
Bacterial Vaginitis (gardnerella): NEGATIVE
Candida Glabrata: NEGATIVE
Candida Vaginitis: POSITIVE — AB
Comment: NEGATIVE
Comment: NEGATIVE
Comment: NEGATIVE

## 2020-05-17 ENCOUNTER — Telehealth: Payer: Self-pay

## 2020-05-17 NOTE — Telephone Encounter (Signed)
mychart message sent to patient with Dr Evans instructions. 

## 2020-11-05 ENCOUNTER — Encounter: Payer: BC Managed Care – PPO | Admitting: Obstetrics and Gynecology

## 2020-11-14 ENCOUNTER — Encounter: Payer: BC Managed Care – PPO | Admitting: Obstetrics and Gynecology

## 2021-06-02 ENCOUNTER — Other Ambulatory Visit: Payer: Self-pay

## 2021-06-02 ENCOUNTER — Other Ambulatory Visit (HOSPITAL_COMMUNITY)
Admission: RE | Admit: 2021-06-02 | Discharge: 2021-06-02 | Disposition: A | Discharge status: Home / Self Care | Payer: BC Managed Care – PPO | Source: Ambulatory Visit | Attending: Obstetrics and Gynecology | Admitting: Obstetrics and Gynecology

## 2021-06-02 ENCOUNTER — Encounter: Payer: Self-pay | Admitting: Obstetrics and Gynecology

## 2021-06-02 ENCOUNTER — Ambulatory Visit (INDEPENDENT_AMBULATORY_CARE_PROVIDER_SITE_OTHER): Payer: BC Managed Care – PPO | Admitting: Obstetrics and Gynecology

## 2021-06-02 VITALS — BP 126/84 | HR 71 | Ht 62.0 in | Wt 166.2 lb

## 2021-06-02 DIAGNOSIS — B3731 Acute candidiasis of vulva and vagina: Secondary | ICD-10-CM

## 2021-06-02 DIAGNOSIS — N898 Other specified noninflammatory disorders of vagina: Secondary | ICD-10-CM

## 2021-06-02 LAB — POCT URINALYSIS DIPSTICK
Bilirubin, UA: NEGATIVE
Blood, UA: NEGATIVE
Glucose, UA: NEGATIVE
Ketones, UA: NEGATIVE
Leukocytes, UA: NEGATIVE
Nitrite, UA: NEGATIVE
Protein, UA: NEGATIVE
Spec Grav, UA: 1.01 (ref 1.010–1.025)
Urobilinogen, UA: 0.2 E.U./dL
pH, UA: 7.5 (ref 5.0–8.0)

## 2021-06-02 MED ORDER — FLUCONAZOLE 150 MG PO TABS: 150.0000 mg | ORAL_TABLET | ORAL | 0 refills | Status: DC

## 2021-06-02 NOTE — Progress Notes (Signed)
Patient presents today for vaginal discharge. Patient states her discharge has been present for 1 week itching no odor, no color. Patient states no burning or dysuria. Patient states no questions or concerns at this time.

## 2021-06-02 NOTE — Addendum Note (Signed)
Addended by: Marykay Lex on: 06/02/2021 10:54 AM   Modules accepted: Orders

## 2021-06-02 NOTE — Progress Notes (Signed)
HPI:      [name] is a 41 y.o. G0P0000 who LMP was No LMP recorded. Patient has had a hysterectomy.  Subjective:   She presents today for vaginal discharge with some itching.  She reports it has been present for approximately 1 week.  She says it is similar to the last time she had a yeast infection.  This was treated with Diflucan successfully. She denies new sexual partners and is not interested in STD testing today.    Hx: The following portions of the patient's history were reviewed and updated as appropriate:             She  has a past medical history of Hypertension. She does not have a problem list on file. She  has a past surgical history that includes Abdominal hysterectomy. Her family history is not on file. She  reports that she has never smoked. She has never used smokeless tobacco. She reports that she does not currently use alcohol. She reports that she does not currently use drugs. She has a current medication list which includes the following prescription(s): alprazolam, aripiprazole, fluconazole, fluvoxamine, and amlodipine. She has No Known Allergies.       Review of Systems:  Review of Systems  Constitutional: Denied constitutional symptoms, night sweats, recent illness, fatigue, fever, insomnia and weight loss.  Eyes: Denied eye symptoms, eye pain, photophobia, vision change and visual disturbance.  Ears/Nose/Throat/Neck: Denied ear, nose, throat or neck symptoms, hearing loss, nasal discharge, sinus congestion and sore throat.  Cardiovascular: Denied cardiovascular symptoms, arrhythmia, chest pain/pressure, edema, exercise intolerance, orthopnea and palpitations.  Respiratory: Denied pulmonary symptoms, asthma, pleuritic pain, productive sputum, cough, dyspnea and wheezing.  Gastrointestinal: Denied, gastro-esophageal reflux, melena, nausea and vomiting.  Genitourinary: See HPI for additional information.  Musculoskeletal: Denied musculoskeletal symptoms,  stiffness, swelling, muscle weakness and myalgia.  Dermatologic: Denied dermatology symptoms, rash and scar.  Neurologic: Denied neurology symptoms, dizziness, headache, neck pain and syncope.  Psychiatric: Denied psychiatric symptoms, anxiety and depression.  Endocrine: Denied endocrine symptoms including hot flashes and night sweats.   Meds:   Current Outpatient Medications on File Prior to Visit  Medication Sig Dispense Refill   ALPRAZolam (XANAX) 0.25 MG tablet Take 0.25 mg by mouth 3 (three) times daily as needed.     ARIPiprazole (ABILIFY) 10 MG tablet      fluvoxaMINE (LUVOX) 100 MG tablet Take by mouth.     amLODipine (NORVASC) 10 MG tablet Take by mouth.     No current facility-administered medications on file prior to visit.      Objective:     Vitals:   06/02/21 1021  BP: 126/84  Pulse: 71   Filed Weights   06/02/21 1021  Weight: 166 lb 3.2 oz (75.4 kg)             Physical examination   Pelvic:   Vulva: Normal appearance.  No lesions.  Vagina: No lesions or abnormalities noted.  Thick white vaginal discharge  Support: Normal pelvic support.  Urethra No masses tenderness or scarring.  Meatus Normal size without lesions or prolapse.  Cervix: Surgically absent  Anus: Normal exam.  No lesions.  Perineum: Normal exam.  No lesions.        Bimanual   Uterus: Surgically absent  Adnexae: No masses.  Non-tender to palpation.  Cul-de-sac: Negative for abnormality.             Assessment:    G0P0000 There are no problems to  display for this patient.    1. Vaginal discharge   2. Monilial vulvovaginitis     Vaginal discharge and symptoms likely consistent with monilia.  Urine performed-no evidence of UTI.   Plan:            1.  We will treat monilia with Diflucan  2.  Nuswab performed  3.  Patient to return for annual examination mammogram and blood work. Orders Orders Placed This Encounter  Procedures   POCT urinalysis dipstick     Meds  ordered this encounter  Medications   fluconazole (DIFLUCAN) 150 MG tablet    Sig: Take 1 tablet (150 mg total) by mouth every 3 (three) days. For three doses    Dispense:  3 tablet    Refill:  0      F/U  Return for Annual Physical. I spent 22 minutes involved in the care of this patient preparing to see the patient by obtaining and reviewing her medical history (including labs, imaging tests and prior procedures), documenting clinical information in the electronic health record (EHR), counseling and coordinating care plans, writing and sending prescriptions, ordering tests or procedures and in direct communicating with the patient and medical staff discussing pertinent items from her history and physical exam.  Finis Bud, M.D. 06/02/2021 10:40 AM

## 2021-06-03 LAB — CERVICOVAGINAL ANCILLARY ONLY
Bacterial Vaginitis (gardnerella): NEGATIVE
Candida Glabrata: NEGATIVE
Candida Vaginitis: NEGATIVE
Comment: NEGATIVE
Comment: NEGATIVE
Comment: NEGATIVE

## 2021-07-16 ENCOUNTER — Encounter: Payer: Self-pay | Admitting: Obstetrics and Gynecology

## 2021-07-16 ENCOUNTER — Ambulatory Visit (INDEPENDENT_AMBULATORY_CARE_PROVIDER_SITE_OTHER): Payer: BC Managed Care – PPO | Admitting: Obstetrics and Gynecology

## 2021-07-16 VITALS — BP 117/81 | HR 64 | Ht 62.0 in | Wt 159.1 lb

## 2021-07-16 DIAGNOSIS — Z01419 Encounter for gynecological examination (general) (routine) without abnormal findings: Secondary | ICD-10-CM | POA: Diagnosis not present

## 2021-07-16 DIAGNOSIS — Z124 Encounter for screening for malignant neoplasm of cervix: Secondary | ICD-10-CM

## 2021-07-16 DIAGNOSIS — Z1231 Encounter for screening mammogram for malignant neoplasm of breast: Secondary | ICD-10-CM

## 2021-07-16 NOTE — Progress Notes (Signed)
Patients presents for annual exam today. Patient states she is doing well. She states she has had a hysterectomy in 2017 due to a precancerous polyp. Patient is due for mammogram, ordered. Patients annual labs are ordered. Patient states no other questions or concerns at this time.   ?

## 2021-07-16 NOTE — Progress Notes (Signed)
HPI: ?     Ms. AUDRENA TALAGA is a 41 y.o. G0P0000 who LMP was No LMP recorded. Patient has had a hysterectomy. ? ?Subjective:  ? ?She presents today for her annual examination.  She reports that her symptoms from last month have now resolved.  She feels well. ?Of significant note, patient has had a previous hysterectomy. ? ?  Hx: ?The following portions of the patient's history were reviewed and updated as appropriate: ?            She  has a past medical history of Hypertension. ?She does not have a problem list on file. ?She  has a past surgical history that includes Abdominal hysterectomy. ?Her family history is not on file. ?She  reports that she has never smoked. She has never used smokeless tobacco. She reports that she does not currently use alcohol. She reports that she does not currently use drugs. ?She has a current medication list which includes the following prescription(s): alprazolam, aripiprazole, fluvoxamine, and amlodipine. ?She has No Known Allergies. ?      ?Review of Systems:  ?Review of Systems ? ?Constitutional: Denied constitutional symptoms, night sweats, recent illness, fatigue, fever, insomnia and weight loss.  ?Eyes: Denied eye symptoms, eye pain, photophobia, vision change and visual disturbance.  ?Ears/Nose/Throat/Neck: Denied ear, nose, throat or neck symptoms, hearing loss, nasal discharge, sinus congestion and sore throat.  ?Cardiovascular: Denied cardiovascular symptoms, arrhythmia, chest pain/pressure, edema, exercise intolerance, orthopnea and palpitations.  ?Respiratory: Denied pulmonary symptoms, asthma, pleuritic pain, productive sputum, cough, dyspnea and wheezing.  ?Gastrointestinal: Denied, gastro-esophageal reflux, melena, nausea and vomiting.  ?Genitourinary: Denied genitourinary symptoms including symptomatic vaginal discharge, pelvic relaxation issues, and urinary complaints.  ?Musculoskeletal: Denied musculoskeletal symptoms, stiffness, swelling, muscle weakness and  myalgia.  ?Dermatologic: Denied dermatology symptoms, rash and scar.  ?Neurologic: Denied neurology symptoms, dizziness, headache, neck pain and syncope.  ?Psychiatric: Denied psychiatric symptoms, anxiety and depression.  ?Endocrine: Denied endocrine symptoms including hot flashes and night sweats.  ? ?Meds: ?  ?Current Outpatient Medications on File Prior to Visit  ?Medication Sig Dispense Refill  ? ALPRAZolam (XANAX) 0.25 MG tablet Take 0.25 mg by mouth 3 (three) times daily as needed.    ? ARIPiprazole (ABILIFY) 10 MG tablet     ? fluvoxaMINE (LUVOX) 100 MG tablet Take by mouth.    ? amLODipine (NORVASC) 10 MG tablet Take by mouth.    ? ?No current facility-administered medications on file prior to visit.  ? ? ? ?Objective:  ?  ? ?Vitals:  ? 07/16/21 0807  ?BP: 117/81  ?Pulse: 64  ?  ?Filed Weights  ? 07/16/21 0807  ?Weight: 159 lb 1.6 oz (72.2 kg)  ? ?  ?         Physical examination ?General NAD, Conversant  ?HEENT Atraumatic; Op clear with mmm.  Normo-cephalic. Pupils reactive. Anicteric sclerae  ?Thyroid/Neck Smooth without nodularity or enlargement. Normal ROM.  Neck Supple.  ?Skin No rashes, lesions or ulceration. Normal palpated skin turgor. No nodularity.  ?Breasts: No masses or discharge.  Symmetric.  No axillary adenopathy.  ?Lungs: Clear to auscultation.No rales or wheezes. Normal Respiratory effort, no retractions.  ?Heart: NSR.  No murmurs or rubs appreciated. No periferal edema  ?Abdomen: Soft.  Non-tender.  No masses.  No HSM. No hernia  ?Extremities: Moves all appropriately.  Normal ROM for age. No lymphadenopathy.  ?Neuro: Oriented to PPT.  Normal mood. Normal affect.  ? ?  Pelvic:   ?Vulva: Normal appearance.  No lesions.   ?Vagina: No lesions or abnormalities noted.  ?Support: Normal pelvic support.  ?Urethra No masses tenderness or scarring.  ?Meatus Normal size without lesions or prolapse.  ?Cervix: Surgically absent   ?Anus: Normal exam.  No lesions.  ?Perineum: Normal exam.  No lesions.  ?       Bimanual   ?Uterus: Surgically absent   ?Adnexae: No masses.  Non-tender to palpation.  ?Cul-de-sac: Negative for abnormality.  ? ? ? ?Assessment:  ?  ?G0P0000 ?There are no problems to display for this patient. ? ?  ?1. Well woman exam with routine gynecological exam   ?2. Screening mammogram for breast cancer   ? ? Normal exam ? ? ?Plan:  ?  ?       ? 1.  Basic Screening Recommendations ?The basic screening recommendations for asymptomatic women were discussed with the patient during her visit.  The age-appropriate recommendations were discussed with her and the rational for the tests reviewed.  When I am informed by the patient that another primary care physician has previously obtained the age-appropriate tests and they are up-to-date, only outstanding tests are ordered and referrals given as necessary.  Abnormal results of tests will be discussed with her when all of her results are completed.  Routine preventative health maintenance measures emphasized: Exercise/Diet/Weight control, Tobacco Warnings, Alcohol/Substance use risks and Stress Management ?Mammogram ordered-blood work today. ? ?Orders ?Orders Placed This Encounter  ?Procedures  ? MM DIGITAL SCREENING BILATERAL  ? Basic metabolic panel  ? CBC  ? TSH  ? Lipid panel  ? Hemoglobin A1c  ? ? No orders of the defined types were placed in this encounter. ?    ?  ?  F/U ? No follow-ups on file. ? ?Elonda Husky, M.D. ?07/16/2021 ?8:32 AM ? ? ? ?

## 2021-07-28 LAB — BASIC METABOLIC PANEL
BUN/Creatinine Ratio: 12 (ref 9–23)
BUN: 9 mg/dL (ref 6–24)
CO2: 22 mmol/L (ref 20–29)
Calcium: 9.5 mg/dL (ref 8.7–10.2)
Chloride: 100 mmol/L (ref 96–106)
Creatinine, Ser: 0.78 mg/dL (ref 0.57–1.00)
Glucose: 94 mg/dL (ref 70–99)
Potassium: 3.9 mmol/L (ref 3.5–5.2)
Sodium: 140 mmol/L (ref 134–144)
eGFR: 98 mL/min/{1.73_m2} (ref >59)

## 2021-07-28 LAB — TSH: TSH: 2 u[IU]/mL (ref 0.450–4.500)

## 2021-07-28 LAB — LIPID PANEL
Chol/HDL Ratio: 4.3 ratio (ref 0.0–4.4)
Cholesterol, Total: 217 mg/dL — ABNORMAL HIGH (ref 100–199)
HDL: 51 mg/dL (ref >39)

## 2021-07-28 LAB — CBC
Hematocrit: 40.7 % (ref 34.0–46.6)
Hemoglobin: 13.8 g/dL (ref 11.1–15.9)
MCH: 30.5 pg (ref 26.6–33.0)
MCHC: 33.9 g/dL (ref 31.5–35.7)
MCV: 90 fL (ref 79–97)
Platelets: 218 10*3/uL (ref 150–450)
RBC: 4.52 x10E6/uL (ref 3.77–5.28)
RDW: 12.7 % (ref 11.7–15.4)
WBC: 3.7 10*3/uL (ref 3.4–10.8)

## 2021-07-28 LAB — HEMOGLOBIN A1C
Est. average glucose Bld gHb Est-mCnc: 100 mg/dL
Hgb A1c MFr Bld: 5.1 % (ref 4.8–5.6)

## 2021-07-29 NOTE — Progress Notes (Signed)
See lipid panel results. ?Taylor-please send her the electronic handout on elevated cholesterol and diet.

## 2021-07-30 NOTE — Progress Notes (Signed)
Sent via mychart

## 2021-08-25 ENCOUNTER — Ambulatory Visit
Admission: RE | Admit: 2021-08-25 | Discharge: 2021-08-25 | Disposition: A | Discharge status: Home / Self Care | Payer: BC Managed Care – PPO | Source: Ambulatory Visit | Attending: Obstetrics and Gynecology | Admitting: Obstetrics and Gynecology

## 2021-08-25 DIAGNOSIS — Z01419 Encounter for gynecological examination (general) (routine) without abnormal findings: Secondary | ICD-10-CM | POA: Insufficient documentation

## 2021-08-25 DIAGNOSIS — Z1231 Encounter for screening mammogram for malignant neoplasm of breast: Secondary | ICD-10-CM | POA: Insufficient documentation

## 2021-08-26 ENCOUNTER — Other Ambulatory Visit: Payer: Self-pay | Admitting: Obstetrics and Gynecology

## 2021-08-26 DIAGNOSIS — R928 Other abnormal and inconclusive findings on diagnostic imaging of breast: Secondary | ICD-10-CM

## 2021-08-26 DIAGNOSIS — N6489 Other specified disorders of breast: Secondary | ICD-10-CM

## 2021-09-17 ENCOUNTER — Ambulatory Visit
Admission: RE | Admit: 2021-09-17 | Discharge: 2021-09-17 | Disposition: A | Discharge status: Home / Self Care | Payer: BC Managed Care – PPO | Source: Ambulatory Visit | Attending: Obstetrics and Gynecology | Admitting: Obstetrics and Gynecology

## 2021-09-17 ENCOUNTER — Other Ambulatory Visit: Payer: Self-pay | Admitting: Obstetrics and Gynecology

## 2021-09-17 DIAGNOSIS — N6489 Other specified disorders of breast: Secondary | ICD-10-CM | POA: Insufficient documentation

## 2021-09-17 DIAGNOSIS — R928 Other abnormal and inconclusive findings on diagnostic imaging of breast: Secondary | ICD-10-CM | POA: Insufficient documentation

## 2021-09-17 DIAGNOSIS — R922 Inconclusive mammogram: Secondary | ICD-10-CM | POA: Diagnosis not present

## 2021-09-17 DIAGNOSIS — N6012 Diffuse cystic mastopathy of left breast: Secondary | ICD-10-CM | POA: Diagnosis not present

## 2021-10-07 ENCOUNTER — Ambulatory Visit
Admission: RE | Admit: 2021-10-07 | Discharge: 2021-10-07 | Disposition: A | Discharge status: Home / Self Care | Payer: BC Managed Care – PPO | Source: Ambulatory Visit | Attending: Obstetrics and Gynecology | Admitting: Obstetrics and Gynecology

## 2021-10-07 DIAGNOSIS — R928 Other abnormal and inconclusive findings on diagnostic imaging of breast: Secondary | ICD-10-CM

## 2021-10-07 DIAGNOSIS — N6322 Unspecified lump in the left breast, upper inner quadrant: Secondary | ICD-10-CM | POA: Diagnosis not present

## 2021-10-07 DIAGNOSIS — N6032 Fibrosclerosis of left breast: Secondary | ICD-10-CM | POA: Diagnosis not present

## 2021-10-07 HISTORY — PX: BREAST BIOPSY: SHX20

## 2021-10-08 LAB — SURGICAL PATHOLOGY

## 2021-10-20 DIAGNOSIS — F319 Bipolar disorder, unspecified: Secondary | ICD-10-CM | POA: Diagnosis not present

## 2021-11-18 DIAGNOSIS — K760 Fatty (change of) liver, not elsewhere classified: Secondary | ICD-10-CM | POA: Diagnosis not present

## 2021-11-18 DIAGNOSIS — R251 Tremor, unspecified: Secondary | ICD-10-CM | POA: Diagnosis not present

## 2021-11-18 DIAGNOSIS — F39 Unspecified mood [affective] disorder: Secondary | ICD-10-CM | POA: Diagnosis not present

## 2021-11-18 DIAGNOSIS — Z6829 Body mass index (BMI) 29.0-29.9, adult: Secondary | ICD-10-CM | POA: Diagnosis not present

## 2022-01-11 DIAGNOSIS — J0140 Acute pansinusitis, unspecified: Secondary | ICD-10-CM | POA: Diagnosis not present

## 2022-01-11 DIAGNOSIS — Z6829 Body mass index (BMI) 29.0-29.9, adult: Secondary | ICD-10-CM | POA: Diagnosis not present

## 2022-01-11 DIAGNOSIS — J22 Unspecified acute lower respiratory infection: Secondary | ICD-10-CM | POA: Diagnosis not present

## 2022-01-13 DIAGNOSIS — F319 Bipolar disorder, unspecified: Secondary | ICD-10-CM | POA: Diagnosis not present

## 2022-01-16 DIAGNOSIS — J329 Chronic sinusitis, unspecified: Secondary | ICD-10-CM | POA: Diagnosis not present

## 2022-01-16 DIAGNOSIS — U071 COVID-19: Secondary | ICD-10-CM | POA: Diagnosis not present

## 2022-02-20 DIAGNOSIS — L249 Irritant contact dermatitis, unspecified cause: Secondary | ICD-10-CM | POA: Diagnosis not present

## 2022-03-17 DIAGNOSIS — R251 Tremor, unspecified: Secondary | ICD-10-CM | POA: Diagnosis not present

## 2022-03-17 DIAGNOSIS — F419 Anxiety disorder, unspecified: Secondary | ICD-10-CM | POA: Diagnosis not present

## 2022-03-26 DIAGNOSIS — Z79899 Other long term (current) drug therapy: Secondary | ICD-10-CM | POA: Diagnosis not present

## 2022-03-26 DIAGNOSIS — N9089 Other specified noninflammatory disorders of vulva and perineum: Secondary | ICD-10-CM | POA: Diagnosis not present

## 2022-04-15 DIAGNOSIS — R7989 Other specified abnormal findings of blood chemistry: Secondary | ICD-10-CM | POA: Diagnosis not present

## 2022-04-15 DIAGNOSIS — E785 Hyperlipidemia, unspecified: Secondary | ICD-10-CM | POA: Diagnosis not present

## 2022-04-16 ENCOUNTER — Encounter: Payer: Self-pay | Admitting: Obstetrics and Gynecology

## 2022-04-16 DIAGNOSIS — R21 Rash and other nonspecific skin eruption: Secondary | ICD-10-CM | POA: Diagnosis not present

## 2022-04-16 DIAGNOSIS — L309 Dermatitis, unspecified: Secondary | ICD-10-CM | POA: Diagnosis not present

## 2022-04-16 DIAGNOSIS — L298 Other pruritus: Secondary | ICD-10-CM | POA: Diagnosis not present

## 2022-06-25 ENCOUNTER — Ambulatory Visit: Payer: BC Managed Care – PPO | Admitting: Physician Assistant

## 2022-07-21 ENCOUNTER — Encounter: Payer: BC Managed Care – PPO | Admitting: Obstetrics and Gynecology

## 2022-07-28 ENCOUNTER — Ambulatory Visit: Payer: BC Managed Care – PPO | Admitting: Nurse Practitioner

## 2022-07-28 DIAGNOSIS — R7989 Other specified abnormal findings of blood chemistry: Secondary | ICD-10-CM | POA: Diagnosis not present

## 2022-07-28 DIAGNOSIS — E785 Hyperlipidemia, unspecified: Secondary | ICD-10-CM | POA: Diagnosis not present

## 2022-08-11 DIAGNOSIS — F319 Bipolar disorder, unspecified: Secondary | ICD-10-CM | POA: Diagnosis not present

## 2022-08-11 DIAGNOSIS — Z0389 Encounter for observation for other suspected diseases and conditions ruled out: Secondary | ICD-10-CM | POA: Diagnosis not present

## 2022-08-25 ENCOUNTER — Ambulatory Visit: Payer: BC Managed Care – PPO | Admitting: Physician Assistant

## 2022-08-25 ENCOUNTER — Ambulatory Visit: Payer: BC Managed Care – PPO | Admitting: Nurse Practitioner

## 2022-08-25 DIAGNOSIS — E049 Nontoxic goiter, unspecified: Secondary | ICD-10-CM | POA: Diagnosis not present

## 2022-08-25 DIAGNOSIS — E0590 Thyrotoxicosis, unspecified without thyrotoxic crisis or storm: Secondary | ICD-10-CM | POA: Diagnosis not present

## 2022-09-08 ENCOUNTER — Ambulatory Visit: Payer: BC Managed Care – PPO | Admitting: Obstetrics and Gynecology

## 2022-09-09 ENCOUNTER — Ambulatory Visit (INDEPENDENT_AMBULATORY_CARE_PROVIDER_SITE_OTHER): Payer: BC Managed Care – PPO | Admitting: Obstetrics and Gynecology

## 2022-09-09 ENCOUNTER — Encounter: Payer: Self-pay | Admitting: Obstetrics and Gynecology

## 2022-09-09 VITALS — BP 109/70 | HR 69 | Ht 62.0 in | Wt 150.9 lb

## 2022-09-09 DIAGNOSIS — Z1231 Encounter for screening mammogram for malignant neoplasm of breast: Secondary | ICD-10-CM

## 2022-09-09 DIAGNOSIS — Z01419 Encounter for gynecological examination (general) (routine) without abnormal findings: Secondary | ICD-10-CM | POA: Diagnosis not present

## 2022-09-09 NOTE — Progress Notes (Signed)
HPI:      [name] is a 42 y.o. G0P0000 who LMP was No LMP recorded. Patient has had a hysterectomy.  Subjective:   She presents today for her annual examination.  She has no complaints.  She has no concerns that she would like addressed today.  She is fasting for blood work.  She is up-to-date on all of her testing except mammography.    Hx: The following portions of the patient's history were reviewed and updated as appropriate:             She  has a past medical history of Hypertension. She does not have a problem list on file. She  has a past surgical history that includes Abdominal hysterectomy and Breast biopsy (Left, 10/07/2021). Her family history includes Breast cancer in her maternal aunt. She  reports that she has never smoked. She has never used smokeless tobacco. She reports that she does not currently use alcohol. She reports that she does not currently use drugs. She has a current medication list which includes the following prescription(s): aripiprazole, fluvoxamine, lisdexamfetamine dimesylate, and amlodipine. She has No Known Allergies.       Review of Systems:  Review of Systems  Constitutional: Denied constitutional symptoms, night sweats, recent illness, fatigue, fever, insomnia and weight loss.  Eyes: Denied eye symptoms, eye pain, photophobia, vision change and visual disturbance.  Ears/Nose/Throat/Neck: Denied ear, nose, throat or neck symptoms, hearing loss, nasal discharge, sinus congestion and sore throat.  Cardiovascular: Denied cardiovascular symptoms, arrhythmia, chest pain/pressure, edema, exercise intolerance, orthopnea and palpitations.  Respiratory: Denied pulmonary symptoms, asthma, pleuritic pain, productive sputum, cough, dyspnea and wheezing.  Gastrointestinal: Denied, gastro-esophageal reflux, melena, nausea and vomiting.  Genitourinary: Denied genitourinary symptoms including symptomatic vaginal discharge, pelvic relaxation issues, and  urinary complaints.  Musculoskeletal: Denied musculoskeletal symptoms, stiffness, swelling, muscle weakness and myalgia.  Dermatologic: Denied dermatology symptoms, rash and scar.  Neurologic: Denied neurology symptoms, dizziness, headache, neck pain and syncope.  Psychiatric: Denied psychiatric symptoms, anxiety and depression.  Endocrine: Denied endocrine symptoms including hot flashes and night sweats.   Meds:   Current Outpatient Medications on File Prior to Visit  Medication Sig Dispense Refill   ARIPiprazole (ABILIFY) 10 MG tablet      fluvoxaMINE (LUVOX) 100 MG tablet Take by mouth.     Lisdexamfetamine Dimesylate (VYVANSE PO) Take by mouth.     amLODipine (NORVASC) 10 MG tablet Take by mouth.     No current facility-administered medications on file prior to visit.     Objective:     Vitals:   09/09/22 0811  BP: 109/70  Pulse: 69    Filed Weights   09/09/22 0811  Weight: 150 lb 14.4 oz (68.4 kg)              Physical examination General NAD, Conversant  HEENT Atraumatic; Op clear with mmm.  Normo-cephalic. Pupils reactive. Anicteric sclerae  Thyroid/Neck Smooth without nodularity or enlargement. Normal ROM.  Neck Supple.  Skin No rashes, lesions or ulceration. Normal palpated skin turgor. No nodularity.  Breasts: No masses or discharge.  Symmetric.  No axillary adenopathy.  Lungs: Clear to auscultation.No rales or wheezes. Normal Respiratory effort, no retractions.  Heart: NSR.  No murmurs or rubs appreciated. No peripheral edema  Abdomen: Soft.  Non-tender.  No masses.  No HSM. No hernia  Extremities: Moves all appropriately.  Normal ROM for age. No lymphadenopathy.  Neuro: Oriented to PPT.  Normal mood. Normal affect.  Pelvic: Deferred     Assessment:    G0P0000 There are no problems to display for this patient.    1. Well woman exam with routine gynecological exam   2. Screening mammogram for breast cancer     Normal exam.-Patient doing  well   Plan:            1.  Basic Screening Recommendations The basic screening recommendations for asymptomatic women were discussed with the patient during her visit.  The age-appropriate recommendations were discussed with her and the rational for the tests reviewed.  When I am informed by the patient that another primary care physician has previously obtained the age-appropriate tests and they are up-to-date, only outstanding tests are ordered and referrals given as necessary.  Abnormal results of tests will be discussed with her when all of her results are completed.  Routine preventative health maintenance measures emphasized: Exercise/Diet/Weight control, Tobacco Warnings, Alcohol/Substance use risks and Stress Management Blood work today-mammography ordered. Orders Orders Placed This Encounter  Procedures   MM DIGITAL SCREENING BILATERAL   Basic metabolic panel   CBC   TSH   Lipid panel   Hemoglobin A1c    No orders of the defined types were placed in this encounter.         F/U  Return in about 1 year (around 09/09/2023) for Annual Physical.  Elonda Husky, M.D. 09/09/2022 8:46 AM

## 2022-09-09 NOTE — Progress Notes (Signed)
Patients presents for annual exam today. She states doing well over the past year. History of hysterectomy. Due for mammogram, ordered. Annual labs are ordered. She states no other questions or concerns at this time.

## 2022-09-10 LAB — CBC
Hematocrit: 39.2 % (ref 34.0–46.6)
Hemoglobin: 13.2 g/dL (ref 11.1–15.9)
MCH: 31.2 pg (ref 26.6–33.0)
MCHC: 33.7 g/dL (ref 31.5–35.7)
MCV: 93 fL (ref 79–97)
Platelets: 221 10*3/uL (ref 150–450)
RBC: 4.23 x10E6/uL (ref 3.77–5.28)
RDW: 12.1 % (ref 11.7–15.4)
WBC: 3.6 10*3/uL (ref 3.4–10.8)

## 2022-09-10 LAB — LIPID PANEL
Chol/HDL Ratio: 4 ratio (ref 0.0–4.4)
Cholesterol, Total: 186 mg/dL (ref 100–199)
HDL: 46 mg/dL (ref >39)
LDL Chol Calc (NIH): 129 mg/dL — ABNORMAL HIGH (ref 0–99)
Triglycerides: 58 mg/dL (ref 0–149)
VLDL Cholesterol Cal: 11 mg/dL (ref 5–40)

## 2022-09-10 LAB — BASIC METABOLIC PANEL
BUN/Creatinine Ratio: 9 (ref 9–23)
BUN: 8 mg/dL (ref 6–24)
CO2: 21 mmol/L (ref 20–29)
Calcium: 9 mg/dL (ref 8.7–10.2)
Chloride: 106 mmol/L (ref 96–106)
Creatinine, Ser: 0.86 mg/dL (ref 0.57–1.00)
Glucose: 100 mg/dL — ABNORMAL HIGH (ref 70–99)
Potassium: 3.8 mmol/L (ref 3.5–5.2)
Sodium: 140 mmol/L (ref 134–144)
eGFR: 87 mL/min/{1.73_m2} (ref >59)

## 2022-09-10 LAB — HEMOGLOBIN A1C
Est. average glucose Bld gHb Est-mCnc: 103 mg/dL
Hgb A1c MFr Bld: 5.2 % (ref 4.8–5.6)

## 2022-09-10 LAB — TSH: TSH: 1.22 u[IU]/mL (ref 0.450–4.500)

## 2022-09-22 DIAGNOSIS — E049 Nontoxic goiter, unspecified: Secondary | ICD-10-CM | POA: Diagnosis not present

## 2022-09-22 DIAGNOSIS — E0590 Thyrotoxicosis, unspecified without thyrotoxic crisis or storm: Secondary | ICD-10-CM | POA: Diagnosis not present

## 2022-10-07 ENCOUNTER — Other Ambulatory Visit: Payer: Self-pay | Admitting: Obstetrics and Gynecology

## 2022-10-07 DIAGNOSIS — Z1231 Encounter for screening mammogram for malignant neoplasm of breast: Secondary | ICD-10-CM

## 2022-11-03 ENCOUNTER — Ambulatory Visit
Admission: RE | Admit: 2022-11-03 | Discharge: 2022-11-03 | Disposition: A | Discharge status: Home / Self Care | Payer: BC Managed Care – PPO | Source: Ambulatory Visit | Attending: Obstetrics and Gynecology | Admitting: Obstetrics and Gynecology

## 2022-11-03 DIAGNOSIS — Z1231 Encounter for screening mammogram for malignant neoplasm of breast: Secondary | ICD-10-CM | POA: Insufficient documentation

## 2022-11-03 DIAGNOSIS — E0590 Thyrotoxicosis, unspecified without thyrotoxic crisis or storm: Secondary | ICD-10-CM | POA: Diagnosis not present

## 2022-11-12 ENCOUNTER — Ambulatory Visit: Payer: BC Managed Care – PPO | Admitting: Nurse Practitioner

## 2022-12-01 DIAGNOSIS — F1511 Other stimulant abuse, in remission: Secondary | ICD-10-CM | POA: Diagnosis not present

## 2022-12-01 DIAGNOSIS — F1211 Cannabis abuse, in remission: Secondary | ICD-10-CM | POA: Diagnosis not present

## 2022-12-01 DIAGNOSIS — F319 Bipolar disorder, unspecified: Secondary | ICD-10-CM | POA: Diagnosis not present

## 2023-01-14 DIAGNOSIS — F411 Generalized anxiety disorder: Secondary | ICD-10-CM | POA: Diagnosis not present

## 2023-01-14 DIAGNOSIS — F3132 Bipolar disorder, current episode depressed, moderate: Secondary | ICD-10-CM | POA: Diagnosis not present

## 2023-01-26 DIAGNOSIS — F3132 Bipolar disorder, current episode depressed, moderate: Secondary | ICD-10-CM | POA: Diagnosis not present

## 2023-01-26 DIAGNOSIS — F411 Generalized anxiety disorder: Secondary | ICD-10-CM | POA: Diagnosis not present

## 2023-03-09 DIAGNOSIS — F419 Anxiety disorder, unspecified: Secondary | ICD-10-CM | POA: Diagnosis not present

## 2023-03-09 DIAGNOSIS — F319 Bipolar disorder, unspecified: Secondary | ICD-10-CM | POA: Diagnosis not present

## 2023-04-20 DIAGNOSIS — R251 Tremor, unspecified: Secondary | ICD-10-CM | POA: Diagnosis not present

## 2023-06-01 ENCOUNTER — Ambulatory Visit: Payer: BC Managed Care – PPO | Admitting: Physician Assistant

## 2023-06-03 ENCOUNTER — Encounter: Payer: Self-pay | Admitting: Internal Medicine

## 2023-06-03 ENCOUNTER — Ambulatory Visit (INDEPENDENT_AMBULATORY_CARE_PROVIDER_SITE_OTHER): Payer: BC Managed Care – PPO | Admitting: Internal Medicine

## 2023-06-03 VITALS — BP 112/74 | Ht 62.0 in | Wt 156.6 lb

## 2023-06-03 DIAGNOSIS — K582 Mixed irritable bowel syndrome: Secondary | ICD-10-CM

## 2023-06-03 DIAGNOSIS — I10 Essential (primary) hypertension: Secondary | ICD-10-CM

## 2023-06-03 DIAGNOSIS — F988 Other specified behavioral and emotional disorders with onset usually occurring in childhood and adolescence: Secondary | ICD-10-CM | POA: Insufficient documentation

## 2023-06-03 DIAGNOSIS — E7800 Pure hypercholesterolemia, unspecified: Secondary | ICD-10-CM | POA: Diagnosis not present

## 2023-06-03 DIAGNOSIS — K589 Irritable bowel syndrome without diarrhea: Secondary | ICD-10-CM | POA: Insufficient documentation

## 2023-06-03 DIAGNOSIS — E0590 Thyrotoxicosis, unspecified without thyrotoxic crisis or storm: Secondary | ICD-10-CM

## 2023-06-03 DIAGNOSIS — R41840 Attention and concentration deficit: Secondary | ICD-10-CM

## 2023-06-03 DIAGNOSIS — F319 Bipolar disorder, unspecified: Secondary | ICD-10-CM

## 2023-06-03 NOTE — Assessment & Plan Note (Signed)
Encouraged high-fiber diet Okay to take Imodium or MiraLAX OTC as needed

## 2023-06-03 NOTE — Assessment & Plan Note (Signed)
 C-Met and lipid profile today Encouraged her to consume a low-fat diet

## 2023-06-03 NOTE — Patient Instructions (Signed)

## 2023-06-03 NOTE — Progress Notes (Signed)
Subjective:    Patient ID: Angela Aguirre, female    DOB: 12/07/1980, 43 y.o.   MRN: [id number]  HPI  Pt presents to the clinic today to establish care and for management of the conditions listed below.  HTN: Her BP today is 112/74. She is taking amlodipine as prescribed. There is no ECG on file.  Bipolar depression,: Chronic, managed on fluvoxamine and abilify. She is not currently seeing a therapist but she is following with psychiatry. She denies SI/HI.  ADD: She reports mainly inattention. She is taking vyvanse as prescribed. She follows with psychiatry.  Hyperthyroidism: She is not currently taking any medications for this but she has been on methimazole in the past. She follows with endocrinology.  HLD: Her last LDL was 129, triglycerides 58, 08/2022. She is not taking any cholesterol lowering medication at this time. She tries to consume a low fat diet.   IBS: She reports alternating constipation and diarrhea. She is not taking any medications for this. She does not follow with GI.  Review of Systems   Past Medical History:  Diagnosis Date   Hypertension     Current Outpatient Medications  Medication Sig Dispense Refill   amLODipine (NORVASC) 10 MG tablet Take by mouth.     ARIPiprazole (ABILIFY) 10 MG tablet      fluvoxaMINE (LUVOX) 100 MG tablet Take by mouth.     Lisdexamfetamine Dimesylate (VYVANSE PO) Take by mouth.     No current facility-administered medications for this visit.    No Known Allergies  Family History  Problem Relation Age of Onset   Breast cancer Maternal Aunt        9 Maternal Aunts had breast cancer    Social History   Socioeconomic History   Marital status: Single    Spouse name: Not on file   Number of children: Not on file   Years of education: Not on file   Highest education level: Some college, no degree  Occupational History   Not on file  Tobacco Use   Smoking status: Never   Smokeless tobacco: Never  Vaping Use    Vaping status: Every Day   Substances: CBD  Substance and Sexual Activity   Alcohol use: Not Currently   Drug use: Not Currently   Sexual activity: Not Currently    Partners: Male    Birth control/protection: Surgical  Other Topics Concern   Not on file  Social History Narrative   Not on file   Social Drivers of Health   Financial Resource Strain: Low Risk  (05/30/2023)   Overall Financial Resource Strain (CARDIA)    Difficulty of Paying Living Expenses: Not hard at all  Food Insecurity: No Food Insecurity (05/30/2023)   Hunger Vital Sign    Worried About Running Out of Food in the Last Year: Never true    Ran Out of Food in the Last Year: Never true  Transportation Needs: No Transportation Needs (05/30/2023)   PRAPARE - Administrator, Civil Service (Medical): No    Lack of Transportation (Non-Medical): No  Physical Activity: Unknown (05/30/2023)   Exercise Vital Sign    Days of Exercise per Week: 0 days    Minutes of Exercise per Session: Not on file  Stress: No Stress Concern Present (05/30/2023)   [employer] of Occupational Health - Occupational Stress Questionnaire    Feeling of Stress : Only a little  Social Connections: Socially Isolated (05/30/2023)   Social Connection and Isolation  Panel [NHANES]    Frequency of Communication with Friends and Family: Twice a week    Frequency of Social Gatherings with Friends and Family: Once a week    Attends Religious Services: Never    Database administrator or Organizations: No    Attends Engineer, structural: Not on file    Marital Status: Never married  Intimate Partner Violence: Not on file     Constitutional: Denies fever, malaise, fatigue, headache or abrupt weight changes.  HEENT: Denies eye pain, eye redness, ear pain, ringing in the ears, wax buildup, runny nose, nasal congestion, bloody nose, or sore throat. Respiratory: Denies difficulty breathing, shortness of breath, cough or sputum  production.   Cardiovascular: Denies chest pain, chest tightness, palpitations or swelling in the hands or feet.  Gastrointestinal: Pt reports alternating constipation and diarrhea. Denies abdominal pain, bloating, or blood in the stool.  GU: Denies urgency, frequency, pain with urination, burning sensation, blood in urine, odor or discharge. Musculoskeletal: Denies decrease in range of motion, difficulty with gait, muscle pain or joint pain and swelling.  Skin: Denies redness, rashes, lesions or ulcercations.  Neurological: Denies dizziness, difficulty with memory, difficulty with speech or problems with balance and coordination.  Psych: Pt has a history of depression. Denies anxiety, SI/HI.  No other specific complaints in a complete review of systems (except as listed in HPI above).      Objective:   Physical Exam BP 112/74 (BP Location: Left Arm, Patient Position: Sitting, Cuff Size: Normal)   Ht 5\' 2"  (1.575 m)   Wt 156 lb 9.6 oz (71 kg)   BMI 28.64 kg/m   Wt Readings from Last 3 Encounters:  09/09/22 150 lb 14.4 oz (68.4 kg)  07/16/21 159 lb 1.6 oz (72.2 kg)  06/02/21 166 lb 3.2 oz (75.4 kg)    General: Appears her stated age, well developed, well nourished in NAD. Skin: Warm, dry and intact. HEENT: Head: normal shape and size; Eyes: sclera white, no icterus, conjunctiva pink, PERRLA and EOMs intact;  Neck:  Neck supple, trachea midline. Goiter noted. Cardiovascular: Normal rate and rhythm. S1,S2 noted.  No murmur, rubs or gallops noted. No JVD or BLE edema. Pulmonary/Chest: Normal effort and positive vesicular breath sounds. No respiratory distress. No wheezes, rales or ronchi noted.  Musculoskeletal:  No difficulty with gait.  Neurological: Alert and oriented.  Coordination normal.  Psychiatric: Mood and affect normal. Behavior is normal. Judgment and thought content normal.    BMET    Component Value Date/Time   NA 140 09/09/2022 0851   K 3.8 09/09/2022 0851   CL  106 09/09/2022 0851   CO2 21 09/09/2022 0851   GLUCOSE 100 (H) 09/09/2022 0851   BUN 8 09/09/2022 0851   CREATININE 0.86 09/09/2022 0851   CALCIUM 9.0 09/09/2022 0851    Lipid Panel     Component Value Date/Time   CHOL 186 09/09/2022 0851   TRIG 58 09/09/2022 0851   HDL 46 09/09/2022 0851   CHOLHDL 4.0 09/09/2022 0851   LDLCALC 129 (H) 09/09/2022 0851    CBC    Component Value Date/Time   WBC 3.6 09/09/2022 0851   RBC 4.23 09/09/2022 0851   HGB 13.2 09/09/2022 0851   HCT 39.2 09/09/2022 0851   PLT 221 09/09/2022 0851   MCV 93 09/09/2022 0851   MCH 31.2 09/09/2022 0851   MCHC 33.7 09/09/2022 0851   RDW 12.1 09/09/2022 0851    Hgb A1C Lab Results  Component  Value Date   HGBA1C 5.2 09/09/2022            Assessment & Plan:   RTC in 6 months, for your annual exam Nicki Reaper, NP

## 2023-06-03 NOTE — Assessment & Plan Note (Signed)
Controlled on amlodipine Reinforced DASH diet and exercise for weight loss C-Met today 

## 2023-06-03 NOTE — Assessment & Plan Note (Signed)
Continue Vyvanse per psychiatry

## 2023-06-03 NOTE — Assessment & Plan Note (Signed)
Continue fluvoxamine and Abilify per psychiatry Support of

## 2023-06-03 NOTE — Assessment & Plan Note (Signed)
TSH and free T4 today We will monitor

## 2023-06-04 ENCOUNTER — Encounter: Payer: Self-pay | Admitting: Internal Medicine

## 2023-06-04 LAB — COMPLETE METABOLIC PANEL WITH GFR
AG Ratio: 1.9 (calc) (ref 1.0–2.5)
ALT: 16 U/L (ref 6–29)
AST: 14 U/L (ref 10–30)
Albumin: 4.5 g/dL (ref 3.6–5.1)
Alkaline phosphatase (APISO): 73 U/L (ref 31–125)
BUN: 11 mg/dL (ref 7–25)
CO2: 25 mmol/L (ref 20–32)
Calcium: 9.4 mg/dL (ref 8.6–10.2)
Chloride: 110 mmol/L (ref 98–110)
Creat: 0.73 mg/dL (ref 0.50–0.99)
Globulin: 2.4 g/dL (ref 1.9–3.7)
Glucose, Bld: 93 mg/dL (ref 65–99)
Potassium: 4.2 mmol/L (ref 3.5–5.3)
Sodium: 141 mmol/L (ref 135–146)
Total Bilirubin: 0.3 mg/dL (ref 0.2–1.2)
Total Protein: 6.9 g/dL (ref 6.1–8.1)
eGFR: 105 mL/min/{1.73_m2} (ref >=60)

## 2023-06-04 LAB — TSH: TSH: 1.2 m[IU]/L

## 2023-06-04 LAB — CBC
HCT: 40.9 % (ref 35.0–45.0)
Hemoglobin: 13.7 g/dL (ref 11.7–15.5)
MCH: 30.7 pg (ref 27.0–33.0)
MCHC: 33.5 g/dL (ref 32.0–36.0)
MCV: 91.7 fL (ref 80.0–100.0)
MPV: 13.3 fL — ABNORMAL HIGH (ref 7.5–12.5)
Platelets: 230 10*3/uL (ref 140–400)
RBC: 4.46 10*6/uL (ref 3.80–5.10)
RDW: 12.1 % (ref 11.0–15.0)
WBC: 3 10*3/uL — ABNORMAL LOW (ref 3.8–10.8)

## 2023-06-04 LAB — LIPID PANEL
Cholesterol: 190 mg/dL (ref <200)
HDL: 52 mg/dL (ref >=50)
LDL Cholesterol (Calc): 124 mg/dL — ABNORMAL HIGH
Non-HDL Cholesterol (Calc): 138 mg/dL — ABNORMAL HIGH (ref <130)
Total CHOL/HDL Ratio: 3.7 (calc) (ref <5.0)
Triglycerides: 51 mg/dL (ref <150)

## 2023-06-04 LAB — T4, FREE: Free T4: 1 ng/dL (ref 0.8–1.8)

## 2023-06-28 DIAGNOSIS — F319 Bipolar disorder, unspecified: Secondary | ICD-10-CM | POA: Diagnosis not present

## 2023-08-06 ENCOUNTER — Ambulatory Visit (INDEPENDENT_AMBULATORY_CARE_PROVIDER_SITE_OTHER): Admitting: Internal Medicine

## 2023-08-06 ENCOUNTER — Encounter: Payer: Self-pay | Admitting: Internal Medicine

## 2023-08-06 VITALS — BP 110/70 | Ht 62.0 in | Wt 163.0 lb

## 2023-08-06 DIAGNOSIS — M7989 Other specified soft tissue disorders: Secondary | ICD-10-CM

## 2023-08-06 DIAGNOSIS — I10 Essential (primary) hypertension: Secondary | ICD-10-CM

## 2023-08-06 NOTE — Patient Instructions (Signed)
 Deep Vein Thrombosis  Deep vein thrombosis (DVT) is a condition in which a blood clot forms in a vein of the deep venous system. This can occur in the lower leg, thigh, pelvis, arm, or neck. A clot is blood that has thickened into a gel or solid. This condition is serious and can be life-threatening if the clot travels to the arteries of the lungs and causes a blockage (pulmonary embolism). A DVT can also damage veins in the leg, which can lead to long-term venous disease, leg pain, swelling, discoloration, and ulcers or sores (post-thrombotic syndrome). What are the causes? This condition may be caused by: A slowdown of blood flow. Damage to a vein. A condition that causes blood to clot more easily, such as certain bleeding disorders. What increases the risk? The following factors may make you more likely to develop this condition: Obesity. Being older, especially older than age 15. Being inactive or not moving around (sedentary lifestyle). This may include: Sitting or lying down for longer than 4-6 hours other than to sleep at night. Being in the hospital, or having major or lengthy surgery. Having any recent bone injuries, such as breaks (fractures), that reduce movement, especially in the lower extremities. Having recent orthopedic surgery on the lower extremities. Being pregnant, giving birth, or having recently given birth. Taking medicines that contain estrogen, such as birth control or hormone replacement therapy. Using products that contain nicotine or tobacco, especially if you use hormonal birth control. Having a history of a blood vessel disease (peripheral vascular disease) or congestive heart disease. Having a history of cancer, especially if being treated with chemotherapy. What are the signs or symptoms? Symptoms of this condition include: Swelling, pain, pressure, or tenderness in an arm or a leg. An arm or a leg becoming warm, red, or discolored. A leg turning very pale or  blue. You may have a large DVT. This is rare. If the clot is in your leg, you may notice that symptoms get worse when you stand or walk. In some cases, there are no symptoms. How is this diagnosed? This condition is diagnosed with: Your medical history and a physical exam. Tests, such as: Blood tests to check how well your blood clots. Doppler ultrasound. This is the best way to find a DVT. CT venogram. Contrast dye is injected into a vein, and X-rays are taken to check for clots. This is helpful for veins in the chest or pelvis. How is this treated? Treatment for this condition depends on: The cause of your DVT. The size and location of your DVT, or having more than one DVT. Your risk for bleeding or developing more clots. Other medical conditions you may have. Treatment may include: Taking a blood thinner medicine (anticoagulant) to prevent more clots from forming or current clots from growing. Wearing compression stockings. Injecting medicines into the affected vein to break up the clot (catheter-directed thrombolysis). Surgical procedures, when DVT is severe or hard to treat. These may be done to: Isolate and remove your clot. Place an inferior vena cava (IVC) filter. This filter is placed into a large vein called the inferior vena cava to catch blood clots before they reach your lungs. You may get some medical treatments for 6 months or longer. Follow these instructions at home: If you are taking blood thinners: Talk with your health care provider before you take any medicines that contain aspirin or NSAIDs, such as ibuprofen. These medicines increase your risk for dangerous bleeding. Take your medicine exactly  as told, at the same time every day. Do not skip a dose. Do not take more than the prescribed dose. This is important. Ask your health care provider about foods and medicines that could change or interact with the way your blood thinner works. Avoid these foods and medicines  if you are told to do so. Avoid anything that may cause bleeding or bruising. You may bleed more easily while taking blood thinners. Be very careful when using knives, scissors, or other sharp objects. Use an electric razor instead of a blade. Avoid activities that could cause injury or bruising, and follow instructions for preventing falls. Tell your health care provider if you have had any internal bleeding, bleeding ulcers, or neurologic diseases, such as strokes or cerebral aneurysms. Wear a medical alert bracelet or carry a card that lists what medicines you take. General instructions Take over-the-counter and prescription medicines only as told by your health care provider. Return to your normal activities as told by your health care provider. Ask your health care provider what activities are safe for you. If recommended, wear compression stockings as told by your health care provider. These stockings help to prevent blood clots and reduce swelling in your legs. Never wear your compression stockings while sleeping at night. Keep all follow-up visits. This is important. Where to find more information American Heart Association: www.heart.org Centers for Disease Control and Prevention: FootballExhibition.com.br National Heart, Lung, and Blood Institute: PopSteam.is Contact a health care provider if: You miss a dose of your blood thinner. You have unusual bruising or other color changes. You have new or worse pain, swelling, or redness in an arm or a leg. You have worsening numbness or tingling in an arm or a leg. You have a significant color change (pale or blue) in the extremity that has the DVT. Get help right away if: You have signs or symptoms that a blood clot has moved to the lungs. These may include: Shortness of breath. Chest pain. Fast or irregular heartbeats (palpitations). Light-headedness, dizziness, or fainting. Coughing up blood. You have signs or symptoms that your blood is  too thin. These may include: Blood in your vomit, stool, or urine. A cut that will not stop bleeding. A menstrual period that is heavier than usual. A severe headache or confusion. These symptoms may be an emergency. Get help right away. Call 911. Do not wait to see if the symptoms will go away. Do not drive yourself to the hospital. Summary Deep vein thrombosis (DVT) happens when a blood clot forms in a deep vein. This may occur in the lower leg, thigh, pelvis, arm, or neck. Symptoms affect the arm or leg and can include swelling, pain, tenderness, warmth, redness, or discoloration. This condition may be treated with medicines. In severe cases, a procedure or surgery may be done to remove or dissolve the clots. If you are taking blood thinners, take them exactly as told. Do not skip a dose. Do not take more than is prescribed. Get help right away if you have a severe headache, shortness of breath, chest pain, fast or irregular heartbeats, or blood in your vomit, urine, or stool. This information is not intended to replace advice given to you by your health care provider. Make sure you discuss any questions you have with your health care provider. Document Revised: 10/21/2020 Document Reviewed: 10/21/2020 Elsevier Patient Education  2024 [employer].

## 2023-08-06 NOTE — Progress Notes (Signed)
 Subjective:    Patient ID: Angela Aguirre, female    DOB: [birth year], 43 y.o.   MRN: [id number]  HPI  Discussed the use of AI scribe software for clinical note transcription with the patient, who gave verbal consent to proceed.  Angela Aguirre is a 43 year old female with hypertension who presents with swelling in her right leg.  She has experienced swelling in her right leg for three days, with the swelling being more pronounced on the right side compared to the left. No redness, warmth, chest pain, or shortness of breath is associated with the swelling.  She is currently taking amlodipine for blood pressure management.       Past Medical History:  Diagnosis Date   ADHD    Anxiety    Depression    Hyperlipidemia    Hypertension     Current Outpatient Medications  Medication Sig Dispense Refill   amLODipine (NORVASC) 10 MG tablet Take 1 tablet by mouth daily.     ARIPiprazole (ABILIFY) 5 MG tablet Take 5 mg by mouth daily.     FLUoxetine (PROZAC) 40 MG capsule Take 40 mg by mouth every morning.     fluvoxaMINE (LUVOX) 100 MG tablet Take 100 mg by mouth daily.     Lisdexamfetamine Dimesylate (VYVANSE PO) Take 20 mg by mouth once.     No current facility-administered medications for this visit.    No Known Allergies  Family History  Problem Relation Age of Onset   Heart disease Mother    Diabetes Father    Breast cancer Maternal Aunt        9 Maternal Aunts had breast cancer    Social History   Socioeconomic History   Marital status: Single    Spouse name: Not on file   Number of children: Not on file   Years of education: Not on file   Highest education level: Some college, no degree  Occupational History   Not on file  Tobacco Use   Smoking status: Never   Smokeless tobacco: Never  Vaping Use   Vaping status: Every Day   Substances: CBD  Substance and Sexual Activity   Alcohol use: Not Currently   Drug use: Not Currently   Sexual activity: Not  Currently    Partners: Male    Birth control/protection: Surgical  Other Topics Concern   Not on file  Social History Narrative   Not on file   Social Drivers of Health   Financial Resource Strain: Low Risk  (05/30/2023)   Overall Financial Resource Strain (CARDIA)    Difficulty of Paying Living Expenses: Not hard at all  Food Insecurity: No Food Insecurity (05/30/2023)   Hunger Vital Sign    Worried About Running Out of Food in the Last Year: Never true    Ran Out of Food in the Last Year: Never true  Transportation Needs: No Transportation Needs (05/30/2023)   PRAPARE - Administrator, Civil Service (Medical): No    Lack of Transportation (Non-Medical): No  Physical Activity: Unknown (05/30/2023)   Exercise Vital Sign    Days of Exercise per Week: 0 days    Minutes of Exercise per Session: Not on file  Stress: No Stress Concern Present (05/30/2023)   [employer] of Occupational Health - Occupational Stress Questionnaire    Feeling of Stress : Only a little  Social Connections: Socially Isolated (05/30/2023)   Social Connection and Isolation Panel [NHANES]    Frequency  of Communication with Friends and Family: Twice a week    Frequency of Social Gatherings with Friends and Family: Once a week    Attends Religious Services: Never    Database administrator or Organizations: No    Attends Engineer, structural: Not on file    Marital Status: Never married  Intimate Partner Violence: Not on file     Constitutional: Denies fever, malaise, fatigue, headache or abrupt weight changes.  Respiratory: Denies difficulty breathing, shortness of breath, cough or sputum production.   Cardiovascular: Patient reports swelling in right calf.  Denies chest pain, chest tightness, palpitations or swelling in the hands.  Musculoskeletal: Denies decrease in range of motion, difficulty with gait, muscle pain or joint pain and swelling.  Skin: Denies redness, rashes, lesions or  ulcercations.  Neurological: Denies dizziness, difficulty with memory, difficulty with speech or problems with balance and coordination.   No other specific complaints in a complete review of systems (except as listed in HPI above).  Objective:   Physical Exam BP 110/70 (BP Location: Left Arm, Patient Position: Sitting, Cuff Size: Normal)   Ht 5\' 2"  (1.575 m)   Wt 163 lb (73.9 kg)   BMI 29.81 kg/m    Wt Readings from Last 3 Encounters:  06/03/23 156 lb 9.6 oz (71 kg)  09/09/22 150 lb 14.4 oz (68.4 kg)  07/16/21 159 lb 1.6 oz (72.2 kg)    General: Appears her stated age, overweight, in NAD. Skin: Warm, dry and intact.  No redness or warmth noted of the RLE. Cardiovascular: Normal rate and rhythm. S1,S2 noted.  No murmur, rubs or gallops noted.  Swelling noted of the right posterior calf with negative Homans' sign.  Right calf measures 43.5 cm.  Left calf measures 40 cm. Pulmonary/Chest: Normal effort and positive vesicular breath sounds. No respiratory distress. No wheezes, rales or ronchi noted.  Musculoskeletal: No joint swelling noted.  No difficulty with gait.  Neurological: Alert and oriented.  Coordination normal.    BMET    Component Value Date/Time   NA 141 06/03/2023 1100   NA 140 09/09/2022 0851   K 4.2 06/03/2023 1100   CL 110 06/03/2023 1100   CO2 25 06/03/2023 1100   GLUCOSE 93 06/03/2023 1100   BUN 11 06/03/2023 1100   BUN 8 09/09/2022 0851   CREATININE 0.73 06/03/2023 1100   CALCIUM 9.4 06/03/2023 1100    Lipid Panel     Component Value Date/Time   CHOL 190 06/03/2023 1100   CHOL 186 09/09/2022 0851   TRIG 51 06/03/2023 1100   HDL 52 06/03/2023 1100   HDL 46 09/09/2022 0851   CHOLHDL 3.7 06/03/2023 1100   LDLCALC 124 (H) 06/03/2023 1100    CBC    Component Value Date/Time   WBC 3.0 (L) 06/03/2023 1100   RBC 4.46 06/03/2023 1100   HGB 13.7 06/03/2023 1100   HGB 13.2 09/09/2022 0851   HCT 40.9 06/03/2023 1100   HCT 39.2 09/09/2022 0851   PLT  230 06/03/2023 1100   PLT 221 09/09/2022 0851   MCV 91.7 06/03/2023 1100   MCV 93 09/09/2022 0851   MCH 30.7 06/03/2023 1100   MCHC 33.5 06/03/2023 1100   RDW 12.1 06/03/2023 1100   RDW 12.1 09/09/2022 0851    Hgb A1C Lab Results  Component Value Date   HGBA1C 5.2 09/09/2022            Assessment & Plan:    Assessment and Plan  Right calf swelling Unilateral right leg swelling for three days. Differential includes medication side effect versus deep vein thrombosis. - Order stat ultrasound of lower extremity to rule out deep vein thrombosis. - If ultrasound is negative, change antihypertensive medication from amlodipine to olmesartan 20 mg daily.  Hypertension Hypertension managed with amlodipine, which may contribute to unilateral leg swelling. Discussed medication change to address side effects. - Order stat ultrasound of lower extremity to rule out deep vein thrombosis. - If ultrasound is negative, change antihypertensive medication from amlodipine to olmesartan 20 mg daily.       RTC in 4 months, for your annual exam Helayne Lo, NP

## 2023-08-10 ENCOUNTER — Other Ambulatory Visit: Payer: Self-pay

## 2023-08-10 ENCOUNTER — Ambulatory Visit
Admission: RE | Admit: 2023-08-10 | Discharge: 2023-08-10 | Disposition: A | Discharge status: Home / Self Care | Source: Ambulatory Visit | Attending: Internal Medicine | Admitting: Internal Medicine

## 2023-08-10 ENCOUNTER — Encounter: Payer: Self-pay | Admitting: Internal Medicine

## 2023-08-10 ENCOUNTER — Other Ambulatory Visit: Payer: Self-pay | Admitting: Internal Medicine

## 2023-08-10 DIAGNOSIS — M7989 Other specified soft tissue disorders: Secondary | ICD-10-CM | POA: Insufficient documentation

## 2023-08-10 MED ORDER — OLMESARTAN MEDOXOMIL 20 MG PO TABS: 20.0000 mg | ORAL_TABLET | Freq: Every day | ORAL | 0 refills | Status: DC

## 2023-09-29 ENCOUNTER — Other Ambulatory Visit: Payer: Self-pay | Admitting: Internal Medicine

## 2023-09-29 DIAGNOSIS — Z1231 Encounter for screening mammogram for malignant neoplasm of breast: Secondary | ICD-10-CM

## 2023-10-13 ENCOUNTER — Encounter: Payer: Self-pay | Admitting: Internal Medicine

## 2023-10-13 ENCOUNTER — Ambulatory Visit (INDEPENDENT_AMBULATORY_CARE_PROVIDER_SITE_OTHER): Admitting: Internal Medicine

## 2023-10-13 VITALS — BP 118/78 | Ht 62.0 in | Wt 163.8 lb

## 2023-10-13 DIAGNOSIS — I10 Essential (primary) hypertension: Secondary | ICD-10-CM

## 2023-10-13 DIAGNOSIS — M7989 Other specified soft tissue disorders: Secondary | ICD-10-CM | POA: Diagnosis not present

## 2023-10-13 NOTE — Progress Notes (Signed)
 Subjective:    Patient ID: Angela Aguirre, female    DOB: 06-09-1980, 43 y.o.   MRN: [id number]  HPI   Discussed the use of AI scribe software for clinical note transcription with the patient, who gave verbal consent to proceed.  Angela Aguirre is a 43 year old female who presents with right leg pain and swelling.  She has been experiencing persistent swelling and a sensation of constriction in her right leg, describing it as 'really tight' and 'bruised'. The symptoms are primarily localized on the inside of her calf. No issues are noted with the posterior calf or the left leg, although the left leg feels slightly tight.  The swelling in her right leg began approximately three months ago, two weeks prior to her last visit on August 06, 2023. An ultrasound performed at that time ruled out deep vein thrombosis (DVT). She was previously on amlodipine, which was suspected to contribute to the swelling, and was switched to olmesartan , resulting in a significant reduction in swelling.  She mentions that her right leg has always been slightly larger than the left. Additionally, she notes a bruise on her leg caused by sitting near a heater, which is unrelated to her current symptoms.  Her current medications include olmesartan , which has maintained her blood pressure at 118/78 mmHg.      Past Medical History:  Diagnosis Date   ADHD    Anxiety    Depression    Hyperlipidemia    Hypertension     Current Outpatient Medications  Medication Sig Dispense Refill   ARIPiprazole (ABILIFY) 5 MG tablet Take 5 mg by mouth daily.     FLUoxetine (PROZAC) 40 MG capsule Take 40 mg by mouth every morning.     fluvoxaMINE (LUVOX) 100 MG tablet Take 100 mg by mouth daily.     Lisdexamfetamine Dimesylate (VYVANSE PO) Take 20 mg by mouth once.     olmesartan  (BENICAR ) 20 MG tablet Take 1 tablet (20 mg total) by mouth daily. 90 tablet 0   No current facility-administered medications for this visit.     No Known Allergies  Family History  Problem Relation Age of Onset   Heart disease Mother    Diabetes Father    Breast cancer Maternal Aunt        9 Maternal Aunts had breast cancer    Social History   Socioeconomic History   Marital status: Single    Spouse name: Not on file   Number of children: Not on file   Years of education: Not on file   Highest education level: Some college, no degree  Occupational History   Not on file  Tobacco Use   Smoking status: Never   Smokeless tobacco: Never  Vaping Use   Vaping status: Every Day   Substances: CBD  Substance and Sexual Activity   Alcohol use: Not Currently   Drug use: Not Currently   Sexual activity: Not Currently    Partners: Male    Birth control/protection: Surgical  Other Topics Concern   Not on file  Social History Narrative   Not on file   Social Drivers of Health   Financial Resource Strain: Low Risk  (10/11/2023)   Overall Financial Resource Strain (CARDIA)    Difficulty of Paying Living Expenses: Not hard at all  Food Insecurity: No Food Insecurity (10/11/2023)   Hunger Vital Sign    Worried About Running Out of Food in the Last Year: Never true  Ran Out of Food in the Last Year: Never true  Transportation Needs: No Transportation Needs (10/11/2023)   PRAPARE - Administrator, Civil Service (Medical): No    Lack of Transportation (Non-Medical): No  Physical Activity: Inactive (10/11/2023)   Exercise Vital Sign    Days of Exercise per Week: 0 days    Minutes of Exercise per Session: Not on file  Stress: No Stress Concern Present (10/11/2023)   [employer] of Occupational Health - Occupational Stress Questionnaire    Feeling of Stress: Only a little  Social Connections: Socially Isolated (10/11/2023)   Social Connection and Isolation Panel    Frequency of Communication with Friends and Family: Twice a week    Frequency of Social Gatherings with Friends and Family: Twice a week     Attends Religious Services: Never    Database administrator or Organizations: No    Attends Engineer, structural: Not on file    Marital Status: Never married  Intimate Partner Violence: Not on file     Constitutional: Denies fever, malaise, fatigue, headache or abrupt weight changes.  Respiratory: Denies difficulty breathing, shortness of breath, cough or sputum production.   Cardiovascular: Patient reports swelling in right calf.  Denies chest pain, chest tightness, palpitations or swelling in the hands.  Musculoskeletal: Denies decrease in range of motion, difficulty with gait, muscle pain or joint pain and swelling.  Skin: Denies redness, rashes, lesions or ulcercations.  Neurological: Denies dizziness, difficulty with memory, difficulty with speech or problems with balance and coordination.   No other specific complaints in a complete review of systems (except as listed in HPI above).  Objective:   Physical Exam BP 118/78 (BP Location: Left Arm, Patient Position: Sitting, Cuff Size: Normal)   Ht 5' 2 (1.575 m)   Wt 163 lb 12.8 oz (74.3 kg)   BMI 29.96 kg/m     Wt Readings from Last 3 Encounters:  08/06/23 163 lb (73.9 kg)  06/03/23 156 lb 9.6 oz (71 kg)  09/09/22 150 lb 14.4 oz (68.4 kg)    General: Appears her stated age, overweight, in NAD. Skin: Warm, dry and intact.  No redness or warmth noted of the RLE.  Bruising noted to the right medial calf. Cardiovascular: Normal rate and rhythm. S1,S2 noted.  No murmur, rubs or gallops noted. Negative Homans' sign.  No swelling noted to BLE. Pulmonary/Chest: Normal effort and positive vesicular breath sounds. No respiratory distress. No wheezes, rales or ronchi noted.  Musculoskeletal: Right calf measures 17-3/4 inches.  Left calf measures 16-1/4 inches.  No difficulty with gait.   Neurological: Alert and oriented.  Coordination normal.    BMET    Component Value Date/Time   NA 141 06/03/2023 1100   NA 140  09/09/2022 0851   K 4.2 06/03/2023 1100   CL 110 06/03/2023 1100   CO2 25 06/03/2023 1100   GLUCOSE 93 06/03/2023 1100   BUN 11 06/03/2023 1100   BUN 8 09/09/2022 0851   CREATININE 0.73 06/03/2023 1100   CALCIUM 9.4 06/03/2023 1100    Lipid Panel     Component Value Date/Time   CHOL 190 06/03/2023 1100   CHOL 186 09/09/2022 0851   TRIG 51 06/03/2023 1100   HDL 52 06/03/2023 1100   HDL 46 09/09/2022 0851   CHOLHDL 3.7 06/03/2023 1100   LDLCALC 124 (H) 06/03/2023 1100    CBC    Component Value Date/Time   WBC 3.0 (L) 06/03/2023 1100  RBC 4.46 06/03/2023 1100   HGB 13.7 06/03/2023 1100   HGB 13.2 09/09/2022 0851   HCT 40.9 06/03/2023 1100   HCT 39.2 09/09/2022 0851   PLT 230 06/03/2023 1100   PLT 221 09/09/2022 0851   MCV 91.7 06/03/2023 1100   MCV 93 09/09/2022 0851   MCH 30.7 06/03/2023 1100   MCHC 33.5 06/03/2023 1100   RDW 12.1 06/03/2023 1100   RDW 12.1 09/09/2022 0851    Hgb A1C Lab Results  Component Value Date   HGBA1C 5.2 09/09/2022            Assessment & Plan:    Assessment and Plan    Right leg swelling Abnormal fat deposits in the right leg suggest lipidemia. Liposuction is the only treatment. Further evaluation needed to rule out vascular issues. - Referral to vascular surgery for further evaluation and treatment -Consider referral to plastics pending vascular evaluation - Communicate follow-up plan via MyChart.  Hypertension Blood pressure well-controlled on olmesartan . - Continue olmesartan . - Ensure sufficient medication refills.         RTC in 1 months, for your annual exam Angeline Laura, NP

## 2023-10-26 ENCOUNTER — Encounter (INDEPENDENT_AMBULATORY_CARE_PROVIDER_SITE_OTHER): Payer: Self-pay | Admitting: Vascular Surgery

## 2023-10-26 ENCOUNTER — Ambulatory Visit (INDEPENDENT_AMBULATORY_CARE_PROVIDER_SITE_OTHER): Admitting: Vascular Surgery

## 2023-10-26 VITALS — BP 132/68 | HR 98 | Resp 20 | Ht 62.0 in | Wt 162.2 lb

## 2023-10-26 DIAGNOSIS — M7989 Other specified soft tissue disorders: Secondary | ICD-10-CM

## 2023-10-26 DIAGNOSIS — E7800 Pure hypercholesterolemia, unspecified: Secondary | ICD-10-CM

## 2023-10-26 DIAGNOSIS — I10 Essential (primary) hypertension: Secondary | ICD-10-CM | POA: Diagnosis not present

## 2023-10-28 ENCOUNTER — Encounter (INDEPENDENT_AMBULATORY_CARE_PROVIDER_SITE_OTHER): Payer: Self-pay

## 2023-11-01 DIAGNOSIS — F319 Bipolar disorder, unspecified: Secondary | ICD-10-CM | POA: Diagnosis not present

## 2023-11-02 ENCOUNTER — Other Ambulatory Visit (INDEPENDENT_AMBULATORY_CARE_PROVIDER_SITE_OTHER): Payer: Self-pay | Admitting: Vascular Surgery

## 2023-11-02 ENCOUNTER — Encounter (INDEPENDENT_AMBULATORY_CARE_PROVIDER_SITE_OTHER): Payer: Self-pay | Admitting: Vascular Surgery

## 2023-11-02 DIAGNOSIS — M79604 Pain in right leg: Secondary | ICD-10-CM

## 2023-11-02 NOTE — Progress Notes (Signed)
 Subjective:    Patient ID: Angela Aguirre, female    DOB: [birth year], 43 y.o.   MRN: [id number] Chief Complaint  Patient presents with   Establish Care    New patient consult Right Leg swelling Ref. Baity    Angela Aguirre is a 43 yo female who present to clinic with Chief complaint of right lower extremity swelling.  Patient states the swelling to her right lower calf just below her knee extending to her ankle started about a year ago.  She endorses having to stretch her leg continuously due to cramping.  She also endorses massaging her leg quite often as well to help with some of the throbbing pain to the area.  Patient endorses that her leg is not as swollen as normal today that it can be worse and her skin can become very tight.  Standing for long hours or walking for long periods of time increases her pain and throbbing to the area.  She endorses her PCP was concerned about possible DVT related to her symptoms so she was sent for bilateral lower extremity ultrasounds which were negative for DVTs and reflux.  She denies any prior trauma or sporting injury to her right lower extremity.  She currently has not tried any conventional therapy to help with the swelling but she does endorse using a heating pad consistently to the area.  She states this helps intermittently but she is also noted that there are now color changes to her skin at the area where she has applied heat.  She does not feel that she has burned her skin but she does endorse heat she is using is quite warm.    Review of Systems  Constitutional: Negative.   Cardiovascular:  Positive for leg swelling.  Skin:  Positive for color change.  All other systems reviewed and are negative.      Objective:   Physical Exam Vitals reviewed.  Constitutional:      Appearance: Normal appearance. She is normal weight.  HENT:     Head: Normocephalic.  Eyes:     Pupils: Pupils are equal, round, and reactive to light.  Cardiovascular:      Rate and Rhythm: Normal rate and regular rhythm.     Pulses: Normal pulses.     Heart sounds: Normal heart sounds.  Pulmonary:     Effort: Pulmonary effort is normal.     Breath sounds: Normal breath sounds.  Abdominal:     General: Abdomen is flat. Bowel sounds are normal.     Palpations: Abdomen is soft.  Musculoskeletal:        General: Tenderness and deformity present.     Right lower leg: Edema present.  Skin:    General: Skin is warm and dry.     Capillary Refill: Capillary refill takes 2 to 3 seconds.  Neurological:     General: No focal deficit present.     Mental Status: She is alert and oriented to person, place, and time. Mental status is at baseline.  Psychiatric:        Mood and Affect: Mood normal.        Behavior: Behavior normal.        Thought Content: Thought content normal.        Judgment: Judgment normal.     BP 132/68 (BP Location: Left Arm, Patient Position: Sitting, Cuff Size: Normal) Comment: manual  Pulse 98   Resp 20   Ht 5' 2 (1.575 m)  Wt 162 lb 3.2 oz (73.6 kg)   BMI 29.67 kg/m   Past Medical History:  Diagnosis Date   ADHD    Anxiety    Depression    Hyperlipidemia    Hypertension     Social History   Socioeconomic History   Marital status: Single    Spouse name: Not on file   Number of children: Not on file   Years of education: Not on file   Highest education level: Some college, no degree  Occupational History   Not on file  Tobacco Use   Smoking status: Never   Smokeless tobacco: Never  Vaping Use   Vaping status: Every Day   Substances: CBD  Substance and Sexual Activity   Alcohol use: Not Currently   Drug use: Not Currently   Sexual activity: Not Currently    Partners: Male    Birth control/protection: Surgical  Other Topics Concern   Not on file  Social History Narrative   Not on file   Social Drivers of Health   Financial Resource Strain: Low Risk  (10/11/2023)   Overall Financial Resource Strain  (CARDIA)    Difficulty of Paying Living Expenses: Not hard at all  Food Insecurity: No Food Insecurity (10/11/2023)   Hunger Vital Sign    Worried About Running Out of Food in the Last Year: Never true    Ran Out of Food in the Last Year: Never true  Transportation Needs: No Transportation Needs (10/11/2023)   PRAPARE - Administrator, Civil Service (Medical): No    Lack of Transportation (Non-Medical): No  Physical Activity: Inactive (10/11/2023)   Exercise Vital Sign    Days of Exercise per Week: 0 days    Minutes of Exercise per Session: Not on file  Stress: No Stress Concern Present (10/11/2023)   [employer] of Occupational Health - Occupational Stress Questionnaire    Feeling of Stress: Only a little  Social Connections: Socially Isolated (10/11/2023)   Social Connection and Isolation Panel    Frequency of Communication with Friends and Family: Twice a week    Frequency of Social Gatherings with Friends and Family: Twice a week    Attends Religious Services: Never    Database administrator or Organizations: No    Attends Engineer, structural: Not on file    Marital Status: Never married  Intimate Partner Violence: Not on file    Past Surgical History:  Procedure Laterality Date   ABDOMINAL HYSTERECTOMY     BREAST BIOPSY Left 10/07/2021   Us  Core Bx 11L00 Ribbon Clip - - FIBROSIS SPANNING UP TO 11 MM, ASSOCIATED WITH 3-4 MM APOCRINE CYST. - FOCAL USUAL DUCTAL HYPERPLASIA. - NEGATIVE FOR ATYPIA AND MALIGNANCY    Family History  Problem Relation Age of Onset   Heart disease Mother    Diabetes Father    Breast cancer Maternal Aunt        9 Maternal Aunts had breast cancer    No Known Allergies     Latest Ref Rng & Units 06/03/2023   11:00 AM 09/09/2022    8:51 AM 07/16/2021    8:34 AM  CBC  WBC 3.8 - 10.8 Thousand/uL 3.0  3.6  3.7   Hemoglobin 11.7 - 15.5 g/dL 86.2  86.7  86.1   Hematocrit 35.0 - 45.0 % 40.9  39.2  40.7   Platelets 140 - 400  Thousand/uL 230  221  218  CMP     Component Value Date/Time   NA 141 06/03/2023 1100   NA 140 09/09/2022 0851   K 4.2 06/03/2023 1100   CL 110 06/03/2023 1100   CO2 25 06/03/2023 1100   GLUCOSE 93 06/03/2023 1100   BUN 11 06/03/2023 1100   BUN 8 09/09/2022 0851   CREATININE 0.73 06/03/2023 1100   CALCIUM 9.4 06/03/2023 1100   PROT 6.9 06/03/2023 1100   AST 14 06/03/2023 1100   ALT 16 06/03/2023 1100   BILITOT 0.3 06/03/2023 1100   EGFR 105 06/03/2023 1100   EGFR 87 09/09/2022 0851     No results found.     Assessment & Plan:   1. Right leg swelling (Primary) Upon examining the patient's right lower extremity her skin is not tight today and she has positive strong palpable pulses.  There is noted discoloration to the skin around her calf area.  However the questionable deformity of her calf actually feels like some type of lipoma.  Its movable and pliable.  I tested her Achilles tendon and that is fully intact.  She can wiggle all of her toes and move all of her ankle without difficulty.  She is not having any muscle spasms or cramping today.  She recently underwent venous ultrasounds which were negative for DVT or reflux.  This appears as if there was some type of injury to the tissue at some point in time and her possibly stretching this continuously has disrupted the adipose fatty tissue layer of her calf.  This may have disrupted her lymphatic system where she is getting some type of lymphedema to the lower extremity below this area of tissue that almost appears to hang off of her calf.  This does not appear to be a vascular issue at this time.  I would recommend patient follow-up with orthopedics at this time to see if this may be some type of cystic formation to her calf muscle/Achilles tendon.  Consultation will be placed to orthopedics today.  2. Primary hypertension Continue antihypertensive medications as already ordered, these medications have been reviewed and  there are no changes at this time.  3. Pure hypercholesterolemia Continue statin as ordered and reviewed, no changes at this time   Current Outpatient Medications on File Prior to Visit  Medication Sig Dispense Refill   ARIPiprazole (ABILIFY) 5 MG tablet Take 5 mg by mouth daily.     FLUoxetine (PROZAC) 40 MG capsule Take 40 mg by mouth every morning.     fluvoxaMINE (LUVOX) 100 MG tablet Take 100 mg by mouth daily.     Lisdexamfetamine Dimesylate (VYVANSE PO) Take 20 mg by mouth once.     olmesartan  (BENICAR ) 20 MG tablet Take 1 tablet (20 mg total) by mouth daily. 90 tablet 0   topiramate (TOPAMAX) 25 MG tablet Take 25 mg by mouth 2 (two) times daily.     No current facility-administered medications on file prior to visit.    There are no Patient Instructions on file for this visit. No follow-ups on file.   Gwendlyn JONELLE Shank, NP

## 2023-11-06 ENCOUNTER — Other Ambulatory Visit: Payer: Self-pay | Admitting: Internal Medicine

## 2023-11-08 NOTE — Telephone Encounter (Signed)
 Requested Prescriptions  Pending Prescriptions Disp Refills   olmesartan  (BENICAR ) 20 MG tablet [Pharmacy Med Name: OLMESARTAN  MEDOXOMIL 20 MG TAB] 90 tablet 0    Sig: TAKE 1 TABLET BY MOUTH EVERY DAY     Cardiovascular:  Angiotensin Receptor Blockers Passed - 11/08/2023  2:29 PM      Passed - Cr in normal range and within 180 days    Creat  Date Value Ref Range Status  06/03/2023 0.73 0.50 - 0.99 mg/dL Final         Passed - K in normal range and within 180 days    Potassium  Date Value Ref Range Status  06/03/2023 4.2 3.5 - 5.3 mmol/L Final         Passed - Patient is not pregnant      Passed - Last BP in normal range    BP Readings from Last 1 Encounters:  10/26/23 132/68         Passed - Valid encounter within last 6 months    Recent Outpatient Visits           3 weeks ago Right leg swelling   [facility] [place], Angeline ORN, NP   3 months ago Swelling of calf   [facility] [place], Angeline ORN, NP   5 months ago Pure hypercholesterolemia   [facility] [place], Angeline ORN, NP       Future Appointments             In 3 weeks [place], Angeline ORN, NP [facility], [facility]

## 2023-11-16 ENCOUNTER — Ambulatory Visit
Admission: RE | Admit: 2023-11-16 | Discharge: 2023-11-16 | Disposition: A | Discharge status: Home / Self Care | Source: Ambulatory Visit | Attending: Internal Medicine | Admitting: Internal Medicine

## 2023-11-16 DIAGNOSIS — Z1231 Encounter for screening mammogram for malignant neoplasm of breast: Secondary | ICD-10-CM | POA: Insufficient documentation

## 2023-11-18 ENCOUNTER — Other Ambulatory Visit: Payer: Self-pay | Admitting: Internal Medicine

## 2023-11-18 DIAGNOSIS — R928 Other abnormal and inconclusive findings on diagnostic imaging of breast: Secondary | ICD-10-CM

## 2023-11-23 ENCOUNTER — Ambulatory Visit
Admission: RE | Admit: 2023-11-23 | Discharge: 2023-11-23 | Disposition: A | Discharge status: Home / Self Care | Source: Ambulatory Visit | Attending: Internal Medicine | Admitting: Internal Medicine

## 2023-11-23 DIAGNOSIS — N6002 Solitary cyst of left breast: Secondary | ICD-10-CM | POA: Diagnosis not present

## 2023-11-23 DIAGNOSIS — R928 Other abnormal and inconclusive findings on diagnostic imaging of breast: Secondary | ICD-10-CM

## 2023-11-23 DIAGNOSIS — N6325 Unspecified lump in the left breast, overlapping quadrants: Secondary | ICD-10-CM | POA: Diagnosis not present

## 2023-11-23 DIAGNOSIS — R92333 Mammographic heterogeneous density, bilateral breasts: Secondary | ICD-10-CM | POA: Diagnosis not present

## 2023-11-25 ENCOUNTER — Ambulatory Visit
Admission: RE | Admit: 2023-11-25 | Discharge: 2023-11-25 | Disposition: A | Discharge status: Home / Self Care | Attending: Family Medicine | Admitting: Family Medicine

## 2023-11-25 ENCOUNTER — Other Ambulatory Visit: Payer: Self-pay

## 2023-11-25 ENCOUNTER — Emergency Department
Admission: EM | Admit: 2023-11-25 | Discharge: 2023-11-25 | Disposition: A | Discharge status: Home / Self Care | Attending: Emergency Medicine | Admitting: Emergency Medicine

## 2023-11-25 ENCOUNTER — Emergency Department

## 2023-11-25 VITALS — BP 130/85 | HR 81 | Temp 99.5°F | Resp 16

## 2023-11-25 DIAGNOSIS — R1084 Generalized abdominal pain: Secondary | ICD-10-CM | POA: Insufficient documentation

## 2023-11-25 DIAGNOSIS — R197 Diarrhea, unspecified: Secondary | ICD-10-CM | POA: Diagnosis not present

## 2023-11-25 DIAGNOSIS — K921 Melena: Secondary | ICD-10-CM | POA: Diagnosis not present

## 2023-11-25 DIAGNOSIS — R109 Unspecified abdominal pain: Secondary | ICD-10-CM | POA: Diagnosis not present

## 2023-11-25 DIAGNOSIS — Z90710 Acquired absence of both cervix and uterus: Secondary | ICD-10-CM | POA: Diagnosis not present

## 2023-11-25 LAB — COMPREHENSIVE METABOLIC PANEL WITH GFR
ALT: 15 U/L (ref 0–44)
AST: 17 U/L (ref 15–41)
Albumin: 4 g/dL (ref 3.5–5.0)
Alkaline Phosphatase: 63 U/L (ref 38–126)
Anion gap: 8 (ref 5–15)
BUN: 9 mg/dL (ref 6–20)
CO2: 25 mmol/L (ref 22–32)
Calcium: 9.1 mg/dL (ref 8.9–10.3)
Chloride: 106 mmol/L (ref 98–111)
Creatinine, Ser: 0.81 mg/dL (ref 0.44–1.00)
GFR, Estimated: >60 mL/min (ref >60)
Glucose, Bld: 98 mg/dL (ref 70–99)
Potassium: 3.5 mmol/L (ref 3.5–5.1)
Sodium: 139 mmol/L (ref 135–145)
Total Bilirubin: 0.4 mg/dL (ref 0.0–1.2)
Total Protein: 7.4 g/dL (ref 6.5–8.1)

## 2023-11-25 LAB — CBC
HCT: 40 % (ref 36.0–46.0)
Hemoglobin: 13.3 g/dL (ref 12.0–15.0)
MCH: 30.4 pg (ref 26.0–34.0)
MCHC: 33.3 g/dL (ref 30.0–36.0)
MCV: 91.5 fL (ref 80.0–100.0)
Platelets: 274 K/uL (ref 150–400)
RBC: 4.37 MIL/uL (ref 3.87–5.11)
RDW: 12.9 % (ref 11.5–15.5)
WBC: 5 K/uL (ref 4.0–10.5)
nRBC: 0 % (ref 0.0–0.2)

## 2023-11-25 LAB — URINALYSIS, ROUTINE W REFLEX MICROSCOPIC
Bilirubin Urine: NEGATIVE
Glucose, UA: NEGATIVE mg/dL
Hgb urine dipstick: NEGATIVE
Ketones, ur: NEGATIVE mg/dL
Leukocytes,Ua: NEGATIVE
Nitrite: NEGATIVE
Protein, ur: NEGATIVE mg/dL
Specific Gravity, Urine: 1.009 (ref 1.005–1.030)
pH: 8 (ref 5.0–8.0)

## 2023-11-25 LAB — LIPASE, BLOOD: Lipase: 29 U/L (ref 11–51)

## 2023-11-25 LAB — POC URINE PREG, ED: Preg Test, Ur: NEGATIVE

## 2023-11-25 MED ORDER — MORPHINE SULFATE (PF) 2 MG/ML IV SOLN
2.0000 mg | Freq: Once | INTRAVENOUS | Status: AC
  Administered 2023-11-25: 2 mg via INTRAVENOUS
  Filled 2023-11-25: qty 1

## 2023-11-25 MED ORDER — ONDANSETRON HCL 4 MG/2ML IJ SOLN
4.0000 mg | Freq: Once | INTRAMUSCULAR | Status: AC
  Administered 2023-11-25: 4 mg via INTRAVENOUS
  Filled 2023-11-25: qty 2

## 2023-11-25 MED ORDER — IOHEXOL 300 MG/ML  SOLN
100.0000 mL | Freq: Once | INTRAMUSCULAR | Status: AC | PRN
  Administered 2023-11-25: 100 mL via INTRAVENOUS

## 2023-11-25 NOTE — ED Triage Notes (Signed)
 Pt to ED via POV from [facility]. Pt ambulatory to tirage in NAD. Pt reports abd pain and N/V/D x3 wks. Pt reports hx of duodenitis. Pt sent for CT scan.

## 2023-11-25 NOTE — ED Provider Notes (Signed)
 [facility] Department Provider Note     Event Date/Time   First MD Initiated Contact with Patient 11/25/23 1136     (approximate)   History   Abdominal Pain   HPI  Angela Aguirre is Aguirre 43 y.o. female with Aguirre history of duodenitis and IBS presents to the ED for evaluation of generalized abdominal pain x 3 weeks.  Associated symptoms include diarrhea with noted bright red blood in stool twice in the past week, and nausea without vomiting.  Denies fever, sick contacts.  Patient was evaluated at urgent care prior to ED arrival and was advised to seek emergency services.     Physical Exam   Triage Vital Signs: ED Triage Vitals  Encounter Vitals Group     BP 11/25/23 1038 (!) 131/90     Girls Systolic BP Percentile --      Girls Diastolic BP Percentile --      Boys Systolic BP Percentile --      Boys Diastolic BP Percentile --      Pulse Rate 11/25/23 1038 94     Resp 11/25/23 1038 18     Temp 11/25/23 1038 98.8 F (37.1 C)     Temp Source 11/25/23 1038 Oral     SpO2 11/25/23 1038 98 %     Weight 11/25/23 1453 162 lb 4.1 oz (73.6 kg)     Height 11/25/23 1453 5' 2 (1.575 m)     Head Circumference --      Peak Flow --      Pain Score 11/25/23 1038 5     Pain Loc --      Pain Education --      Exclude from Growth Chart --     Most recent vital signs: Vitals:   11/25/23 1147 11/25/23 1501  BP:  117/72  Pulse:  68  Resp:  18  Temp:  98.2 F (36.8 C)  SpO2: 98% 99%    General Well-appearing female.  Awake, no distress.  HEENT NCAT.  CV:  Good peripheral perfusion.  RESP:  Normal effort.  ABD:  No distention.  Soft.  Diffuse tenderness with light palpation in all 4 quadrants.  No masses or organomegaly noted.  ED Results / Procedures / Treatments   Labs (all labs ordered are listed, but only abnormal results are displayed) Labs Reviewed  URINALYSIS, ROUTINE W REFLEX MICROSCOPIC - Abnormal; Notable for the following  components:      Result Value   Color, Urine STRAW (*)    APPearance CLEAR (*)    All other components within normal limits  LIPASE, BLOOD  COMPREHENSIVE METABOLIC PANEL WITH GFR  CBC  POC URINE PREG, ED   RADIOLOGY  I personally viewed and evaluated these images as part of my medical decision making, as well as reviewing the written report by the radiologist.  ED Provider Interpretation: Normal-appearing CT abdomen pelvis will confirm with final radiology read.  CT ABDOMEN PELVIS W CONTRAST Result Date: 11/25/2023 CLINICAL DATA:  Acute abdominal pain.  Diarrhea. EXAM: CT ABDOMEN AND PELVIS WITH CONTRAST TECHNIQUE: Multidetector CT imaging of the abdomen and pelvis was performed using the standard protocol following bolus administration of intravenous contrast. RADIATION DOSE REDUCTION: This exam was performed according to the departmental dose-optimization program which includes automated exposure control, adjustment of the mA and/or kV according to patient size and/or use of iterative reconstruction technique. CONTRAST:  OMNIPAQUE  IOHEXOL  300 MG/ML  SOLN COMPARISON:  None  Available. FINDINGS: Lower chest: No acute abnormality. Hepatobiliary: No focal liver abnormality is seen. No gallstones, gallbladder wall thickening, or biliary dilatation. Pancreas: Unremarkable. No pancreatic ductal dilatation or surrounding inflammatory changes. Spleen: Normal in size without focal abnormality. Adrenals/Urinary Tract: Adrenal glands are unremarkable. Kidneys are normal, without renal calculi, focal lesion, or hydronephrosis. Bladder is unremarkable. Stomach/Bowel: Stomach is within normal limits. Appendix appears normal. No evidence of bowel wall thickening, distention, or inflammatory changes. Vascular/Lymphatic: No significant vascular findings are present. No enlarged abdominal or pelvic lymph nodes. Reproductive: Status post hysterectomy. No adnexal masses. Other: No abdominal wall hernia or  abnormality. No abdominopelvic ascites. Musculoskeletal: No fracture is seen. IMPRESSION: 1. No acute localizing process in the abdomen or pelvis. 2. Status post hysterectomy. Electronically Signed   By: Greig Pique M.D.   On: 11/25/2023 16:25    PROCEDURES:  Critical Care performed: No  Procedures   MEDICATIONS ORDERED IN ED: Medications  morphine  (PF) 2 MG/ML injection 2 mg (2 mg Intravenous Given 11/25/23 1423)  ondansetron  (ZOFRAN ) injection 4 mg (4 mg Intravenous Given 11/25/23 1420)  iohexol  (OMNIPAQUE ) 300 MG/ML solution 100 mL (100 mLs Intravenous Contrast Given 11/25/23 1518)     IMPRESSION / MDM / ASSESSMENT AND PLAN / ED COURSE  I reviewed the triage vital signs and the nursing notes.                              Clinical Course as of 11/25/23 1719  Thu Nov 25, 2023  1556 On reassessment patient states pain improved from 5 to 2. Pending CT Aguirre/P [MH]  1618 CBC Hemoglobin stable and WNL [MH]  1618 Comprehensive metabolic panel Stable and WNL [MH]  1618 Urinalysis, Routine w reflex microscopic -Urine, Clean Catch(!) Normal [MH]  1618 Lipase, blood Normal [MH]  1718 CT ABDOMEN PELVIS W CONTRAST IMPRESSION: 1. No acute localizing process in the abdomen or pelvis. 2. Status post hysterectomy.   [MH]    Clinical Course User Index [MH] Angela Monte A, PA-C    43 y.o. female presents to the emergency department for evaluation and treatment of abdominal pain x 3 weeks. See HPI for further details.   Differential diagnosis includes, but is not limited to IBS, diverticulitis, acute appendicitis, urinary tract infection/pyelonephritis, colitis, gastroenteritis  Patient's presentation is most consistent with acute complicated illness / injury requiring diagnostic workup.  Patient is alert and oriented.  She is hemodynamically stable.  Physical exam findings are as stated above.  Pertinent for diffuse tenderness with light palpation in all 4 quadrants.  Lab work is  reassuring.  Urinalysis is normal.  CT abdomen and pelvis showed no acute localizing process in the abdomen or pelvis.  This is reassuring as well.  Suspicion for presentation consistent with patient's history of IBS.  Advised OTC anti-diarrheal medications.  On reassessment, she reports improved pain.  I have provided her with GI follow-up if symptoms persist otherwise she can follow-up with her primary care provider.  She is in stable condition for discharge home.  ED return precaution discussed.  Patient verbalized understanding is in agreement with this care plan.  FINAL CLINICAL IMPRESSION(S) / ED DIAGNOSES   Final diagnoses:  Generalized abdominal pain  Diarrhea, unspecified type   Rx / DC Orders   ED Discharge Orders     None      Note:  This document was prepared using Dragon voice recognition software and may include unintentional dictation  errors.    Angela, Sholanda Croson A, PA-C 11/25/23 1722    Jacolyn Pae, MD 11/25/23 [phone]

## 2023-11-25 NOTE — ED Notes (Signed)
 Patient is being discharged from the Urgent Care and sent to the Emergency Department via Personal Vehicle . Per Dr. Kriste, patient is in need of higher level of care due to Abdominal Pain. Patient is aware and verbalizes understanding of plan of care.  Vitals:   11/25/23 0941  BP: 130/85  Pulse: 81  Resp: 16  Temp: 99.5 F (37.5 C)  SpO2: 98%

## 2023-11-25 NOTE — Discharge Instructions (Signed)

## 2023-11-25 NOTE — Discharge Instructions (Addendum)
 Your evaluated in the ED for generalized abdominal pain.  Your lab work is reassuring.  Your urinalysis is normal.  Your CT abdomen and pelvis is normal.  Please follow-up with your primary care provider in 3 days for further management.  If symptoms persist follow-up with GI Dr. Therisa.  Call and schedule appointment, in the interim consider reviewing food choices to help relieve diarrhea in adult patients test your discharge papers and over-the-counter Imodium at your local pharmacy.

## 2023-11-25 NOTE — ED Provider Notes (Signed)
 [facility]    CSN: [id number] Arrival date & time: 11/25/23  9074      History   Chief Complaint Chief Complaint  Patient presents with   Abdominal Pain    Duodenitis - Entered by patient    HPI Angela Aguirre is a 42 y.o. female.   HPI  Vali presents for generalized abdominal pain for the past 3 weeks with diarrhea, nausea and cramping. Pain doesn't radiate.  Has history of duodenitis about 7 years ago that was diet related.  She drinks a lot of lemonade and acidic foods.  Acidic foods make the pain worse.  Tried Pepto bismol without relief.  Denies melena.  Has bright red stools once or twice. Last time was  week. She came in today as the symptoms are agressively getting worse. Has 4-5 watery stools per day.  No known sick contacts.    Past Surgeries: abdominal hysterectomy   Symptoms Nausea/Vomiting: yes, nausea   Diarrhea: yes Constipation: yes  Melena/BRBPR: yes  Hematemesis: no  Anorexia: no  Fever/Chills: no  Dysuria: no  Rash: no  Wt loss: no  EtOH use: no  NSAIDs/ASA: no Sore throat: no   Cough: no Nasal congestion : no  Sleep disturbance: yes  Back Pain: no Headache: no   Past Medical History:  Diagnosis Date   ADHD    Anxiety    Depression    Hyperlipidemia    Hypertension     Patient Active Problem List   Diagnosis Date Noted   Pure hypercholesterolemia 06/03/2023   Hyperthyroidism 06/03/2023   HTN (hypertension) 06/03/2023   IBS (irritable bowel syndrome) 06/03/2023   ADD (attention deficit disorder) 06/03/2023   Bipolar depression (HCC) 06/03/2023    Past Surgical History:  Procedure Laterality Date   ABDOMINAL HYSTERECTOMY     BREAST BIOPSY Left 10/07/2021   Us  Core Bx 11L00 Ribbon Clip - - FIBROSIS SPANNING UP TO 11 MM, ASSOCIATED WITH 3-4 MM APOCRINE CYST. - FOCAL USUAL DUCTAL HYPERPLASIA. - NEGATIVE FOR ATYPIA AND MALIGNANCY    OB History     Gravida  0   Para  0   Term  0   Preterm  0   AB  0    Living  0      SAB  0   IAB  0   Ectopic  0   Multiple  0   Live Births  0            Home Medications    Prior to Admission medications   Medication Sig Start Date End Date Taking? Authorizing Provider  ARIPiprazole (ABILIFY) 5 MG tablet Take 5 mg by mouth daily.   Yes [provider]  FLUoxetine (PROZAC) 40 MG capsule Take 40 mg by mouth every morning.   Yes [provider]  fluvoxaMINE (LUVOX) 100 MG tablet Take 100 mg by mouth daily. 09/01/18  Yes [provider]  Lisdexamfetamine Dimesylate (VYVANSE PO) Take 20 mg by mouth once.   Yes [provider]  olmesartan  (BENICAR ) 20 MG tablet TAKE 1 TABLET BY MOUTH EVERY DAY 11/08/23   Antonette Angeline ORN, NP  topiramate (TOPAMAX) 25 MG tablet Take 25 mg by mouth 2 (two) times daily.    [provider]    Family History Family History  Problem Relation Age of Onset   Heart disease Mother    Diabetes Father    Breast cancer Maternal Aunt        9 Maternal  Aunts had breast cancer    Social History Social History   Tobacco Use   Smoking status: Never   Smokeless tobacco: Never  Vaping Use   Vaping status: Every Day   Substances: CBD  Substance Use Topics   Alcohol use: Not Currently   Drug use: Not Currently     Allergies   Patient has no known allergies.   Review of Systems Review of Systems :negative unless otherwise stated in HPI.      Physical Exam Triage Vital Signs ED Triage Vitals [11/25/23 0941]  Encounter Vitals Group     BP 130/85     Girls Systolic BP Percentile      Girls Diastolic BP Percentile      Boys Systolic BP Percentile      Boys Diastolic BP Percentile      Pulse Rate 81     Resp 16     Temp 99.5 F (37.5 C)     Temp Source Oral     SpO2 98 %     Weight      Height      Head Circumference      Peak Flow      Pain Score 5     Pain Loc      Pain Education      Exclude from Growth Chart    No data found.  Updated Vital  Signs BP 130/85 (BP Location: Right Arm)   Pulse 81   Temp 99.5 F (37.5 C) (Oral)   Resp 16   SpO2 98%   Visual Acuity Right Eye Distance:   Left Eye Distance:   Bilateral Distance:    Right Eye Near:   Left Eye Near:    Bilateral Near:     Physical Exam  GEN: uncomfortable appearing female CV: regular rate and rhythm RESP: no increased work of breathing, clear to ascultation bilaterally ABD: Bowel sounds present. Soft, generalized tenderness to light palpation, non-distended.  No guarding, no rebound, no appreciable hepatosplenomegaly MSK: no extremity edema SKIN: warm, dry, no rash on visible skin NEURO: alert, moves all extremities appropriately PSYCH: Normal affect, appropriate speech and behavior   UC Treatments / Results  Labs (all labs ordered are listed, but only abnormal results are displayed) Labs Reviewed - No data to display  EKG  If EKG performed, see my interpretation and MDM section  Radiology    Procedures Procedures (including critical care time)  Medications Ordered in UC Medications - No data to display  Initial Impression / Assessment and Plan / UC Course  I have reviewed the triage vital signs and the nursing notes.  Pertinent labs & imaging results that were available during my care of the patient were reviewed by me and considered in my medical decision making (see chart for details).       Patient is a  43 y.o. femalewith history duodenitis who presents after having insidious severe abdominal pain with diarrhea, nausea and intermittent bright red blood per rectum.   Overall, patient is uncomfortable-appearing, well-hydrated, and in no acute distress.  Vital signs stable.  Rhondais afebrile.  Exam is concerning for an acute abdomen and she has generalized tenderness to light palpation of abdomen.  Given this, recommended ED evaluation and she is agreeable. She will drive to [facility] ED for further evaluation. Pt stable to discharge to the ED  for higher care for her abdominal pain.   Discussed MDM, treatment plan and plan for follow-up with patient who  agrees with plan.    Final Clinical Impressions(s) / UC Diagnoses   Final diagnoses:  Generalized abdominal pain  Diarrhea, unspecified type  Blood in stool     Discharge Instructions      You have been advised to follow up immediately in the emergency department for concerning signs or symptoms as discussed during your visit. If you declined EMS transport, please have a family member take you directly to the ED at this time. Do not delay.   Based on concerns about condition, if you do not follow up in the ED, you may risk poor outcomes including worsening of condition, delayed treatment and potentially life threatening issues. If you have declined to go to the ED at this time, you should call your PCP immediately to set up a follow up appointment.   Go to ED for red flag symptoms, including; fevers you cannot reduce with Tylenol/Motrin, severe headaches, vision changes, numbness/weakness in part of the body, lethargy, confusion, intractable vomiting, severe dehydration, chest pain, breathing difficulty, severe persistent abdominal or pelvic pain, signs of severe infection (increased redness, swelling of an area), feeling faint or passing out, dizziness, etc. You should especially go to the ED for sudden acute worsening of condition if you do not elect to go at this time.       ED Prescriptions   None    PDMP not reviewed this encounter.   Payam Gribble, DO 11/25/23 1106

## 2023-11-25 NOTE — ED Triage Notes (Signed)
 Pt c/o nausea, diarrhea, tenderness in lower abdomin x 3 weeks. Tried taking Pepto bismol with no relief of symptoms.

## 2023-12-02 ENCOUNTER — Encounter: Payer: Self-pay | Admitting: Obstetrics and Gynecology

## 2023-12-02 ENCOUNTER — Ambulatory Visit: Payer: BC Managed Care – PPO | Admitting: Internal Medicine

## 2023-12-02 ENCOUNTER — Encounter (INDEPENDENT_AMBULATORY_CARE_PROVIDER_SITE_OTHER): Payer: Self-pay

## 2023-12-02 ENCOUNTER — Encounter: Payer: Self-pay | Admitting: Internal Medicine

## 2023-12-02 VITALS — BP 122/78 | Ht 62.0 in | Wt 162.6 lb

## 2023-12-02 DIAGNOSIS — K582 Mixed irritable bowel syndrome: Secondary | ICD-10-CM | POA: Diagnosis not present

## 2023-12-02 DIAGNOSIS — E663 Overweight: Secondary | ICD-10-CM

## 2023-12-02 DIAGNOSIS — Z23 Encounter for immunization: Secondary | ICD-10-CM | POA: Diagnosis not present

## 2023-12-02 DIAGNOSIS — Z1159 Encounter for screening for other viral diseases: Secondary | ICD-10-CM

## 2023-12-02 DIAGNOSIS — R739 Hyperglycemia, unspecified: Secondary | ICD-10-CM | POA: Diagnosis not present

## 2023-12-02 DIAGNOSIS — R2241 Localized swelling, mass and lump, right lower limb: Secondary | ICD-10-CM

## 2023-12-02 DIAGNOSIS — Z0001 Encounter for general adult medical examination with abnormal findings: Secondary | ICD-10-CM | POA: Diagnosis not present

## 2023-12-02 DIAGNOSIS — E7800 Pure hypercholesterolemia, unspecified: Secondary | ICD-10-CM | POA: Diagnosis not present

## 2023-12-02 DIAGNOSIS — Z6829 Body mass index (BMI) 29.0-29.9, adult: Secondary | ICD-10-CM

## 2023-12-02 DIAGNOSIS — Z114 Encounter for screening for human immunodeficiency virus [HIV]: Secondary | ICD-10-CM

## 2023-12-02 NOTE — Progress Notes (Signed)
 Subjective:    Patient ID: Angela Aguirre, female    DOB: 1980-08-20, 43 y.o.   MRN: [id number]  HPI  Pt presents to the clinic today for her annual exam.  Of note, patient was recently seen at urgent care on 8/14 with complaint of abdominal pain, nausea, diarrhea and blood per rectum.  They recommended that she go to the ED for further evaluation.  She had a normal lipase metabolic panel, blood count and urinalysis in the ER.  CT abdomen/pelvis showed:  IMPRESSION: 1. No acute localizing process in the abdomen or pelvis. 2. Status post hysterectomy.  They felt like her symptoms were related to IBS, advised her to take OTC antidiarrheals.  They did refer her to GI but she reports she has not set this appointment yet.  She also reports swelling to the right calf has improved although she still has deformity of the adipose tissue.  She still elements of endovascular 7/15 for the same.  They reviewed her ultrasound which was negative for DVT or reflux.  They felt like her issue was related to a lipoma or lymphedema.  They referred her to Ortho for further evaluation but she has not heard anything about this appointment yet.   Flu: never Tetanus: > 10 years COVID: X 1 Pap smear: Hysterectomy Mammogram: 11/2023 Vision screening: as needed Dentist: biannually  Diet: She does eat meat. She consumes fruits and veggies. She does eat fried foods. She drinks mostly juice. Exercise: None  Review of Systems   Past Medical History:  Diagnosis Date   ADHD    Anxiety    Depression    Hyperlipidemia    Hypertension     Current Outpatient Medications  Medication Sig Dispense Refill   ARIPiprazole (ABILIFY) 5 MG tablet Take 5 mg by mouth daily.     FLUoxetine (PROZAC) 40 MG capsule Take 40 mg by mouth every morning.     fluvoxaMINE (LUVOX) 100 MG tablet Take 100 mg by mouth daily.     Lisdexamfetamine Dimesylate (VYVANSE PO) Take 20 mg by mouth once.     olmesartan  (BENICAR ) 20 MG tablet  TAKE 1 TABLET BY MOUTH EVERY DAY 90 tablet 0   topiramate (TOPAMAX) 25 MG tablet Take 25 mg by mouth 2 (two) times daily.     No current facility-administered medications for this visit.    No Known Allergies  Family History  Problem Relation Age of Onset   Heart disease Mother    Diabetes Father    Breast cancer Maternal Aunt        9 Maternal Aunts had breast cancer    Social History   Socioeconomic History   Marital status: Single    Spouse name: Not on file   Number of children: Not on file   Years of education: Not on file   Highest education level: Some college, no degree  Occupational History   Not on file  Tobacco Use   Smoking status: Never   Smokeless tobacco: Never  Vaping Use   Vaping status: Every Day   Substances: CBD  Substance and Sexual Activity   Alcohol use: Not Currently   Drug use: Not Currently   Sexual activity: Not Currently    Partners: Male    Birth control/protection: Surgical  Other Topics Concern   Not on file  Social History Narrative   Not on file   Social Drivers of Health   Financial Resource Strain: Low Risk  (10/11/2023)   Overall  Financial Resource Strain (CARDIA)    Difficulty of Paying Living Expenses: Not hard at all  Food Insecurity: No Food Insecurity (10/11/2023)   Hunger Vital Sign    Worried About Running Out of Food in the Last Year: Never true    Ran Out of Food in the Last Year: Never true  Transportation Needs: No Transportation Needs (10/11/2023)   PRAPARE - Administrator, Civil Service (Medical): No    Lack of Transportation (Non-Medical): No  Physical Activity: Inactive (10/11/2023)   Exercise Vital Sign    Days of Exercise per Week: 0 days    Minutes of Exercise per Session: Not on file  Stress: No Stress Concern Present (10/11/2023)   [employer] of Occupational Health - Occupational Stress Questionnaire    Feeling of Stress: Only a little  Social Connections: Socially Isolated (10/11/2023)    Social Connection and Isolation Panel    Frequency of Communication with Friends and Family: Twice a week    Frequency of Social Gatherings with Friends and Family: Twice a week    Attends Religious Services: Never    Database administrator or Organizations: No    Attends Engineer, structural: Not on file    Marital Status: Never married  Intimate Partner Violence: Not on file     Constitutional: Denies fever, malaise, fatigue, headache or abrupt weight changes.  HEENT: Denies eye pain, eye redness, ear pain, ringing in the ears, wax buildup, runny nose, nasal congestion, bloody nose, or sore throat. Respiratory: Denies difficulty breathing, shortness of breath, cough or sputum production.   Cardiovascular: Denies chest pain, chest tightness, palpitations or swelling in the hands or feet.  Gastrointestinal: Pt reports alternating constipation and diarrhea. Denies abdominal pain, bloating, or blood in the stool.  GU: Denies urgency, frequency, pain with urination, burning sensation, blood in urine, odor or discharge. Musculoskeletal: Patient reports abnormality of right calf.  Denies decrease in range of motion, difficulty with gait, muscle pain or joint pain and swelling.  Skin: Denies redness, rashes, lesions or ulcercations.  Neurological: Patient reports inattention.  Denies dizziness, difficulty with memory, difficulty with speech or problems with balance and coordination.  Psych: Pt has a history of depression. Denies anxiety, SI/HI.  No other specific complaints in a complete review of systems (except as listed in HPI above).      Objective:   Physical Exam BP 122/78 (BP Location: Left Arm, Patient Position: Sitting, Cuff Size: Normal)   Ht 5' 2 (1.575 m)   Wt 162 lb 9.6 oz (73.8 kg)   BMI 29.74 kg/m    Wt Readings from Last 3 Encounters:  11/25/23 162 lb 4.1 oz (73.6 kg)  10/26/23 162 lb 3.2 oz (73.6 kg)  10/13/23 163 lb 12.8 oz (74.3 kg)    General:  Appears her stated age, overweight, in NAD. Skin: Warm, dry and intact. HEENT: Head: normal shape and size; Eyes: sclera white, no icterus, conjunctiva pink, PERRLA and EOMs intact;  Neck:  Neck supple, trachea midline. Goiter noted. Cardiovascular: Normal rate and rhythm. S1,S2 noted.  No murmur, rubs or gallops noted. No JVD or BLE edema. Pulmonary/Chest: Normal effort and positive vesicular breath sounds. No respiratory distress. No wheezes, rales or ronchi noted.  Abdomen: Normal bowel sounds. Musculoskeletal: She still has abnormal fullness to her left medial calf, concerning for lipidemia.  Strength 5/5 BUE/BLE.  No difficulty with gait.  Neurological: Alert and oriented.  Cranial nerves II through XII intact.  Coordination  normal.  Psychiatric: Mood and affect normal. Behavior is normal. Judgment and thought content normal.    BMET    Component Value Date/Time   NA 139 11/25/2023 1040   NA 140 09/09/2022 0851   K 3.5 11/25/2023 1040   CL 106 11/25/2023 1040   CO2 25 11/25/2023 1040   GLUCOSE 98 11/25/2023 1040   BUN 9 11/25/2023 1040   BUN 8 09/09/2022 0851   CREATININE 0.81 11/25/2023 1040   CREATININE 0.73 06/03/2023 1100   CALCIUM  9.1 11/25/2023 1040   GFRNONAA >60 11/25/2023 1040    Lipid Panel     Component Value Date/Time   CHOL 190 06/03/2023 1100   CHOL 186 09/09/2022 0851   TRIG 51 06/03/2023 1100   HDL 52 06/03/2023 1100   HDL 46 09/09/2022 0851   CHOLHDL 3.7 06/03/2023 1100   LDLCALC 124 (H) 06/03/2023 1100    CBC    Component Value Date/Time   WBC 5.0 11/25/2023 1040   RBC 4.37 11/25/2023 1040   HGB 13.3 11/25/2023 1040   HGB 13.2 09/09/2022 0851   HCT 40.0 11/25/2023 1040   HCT 39.2 09/09/2022 0851   PLT 274 11/25/2023 1040   PLT 221 09/09/2022 0851   MCV 91.5 11/25/2023 1040   MCV 93 09/09/2022 0851   MCH 30.4 11/25/2023 1040   MCHC 33.3 11/25/2023 1040   RDW 12.9 11/25/2023 1040   RDW 12.1 09/09/2022 0851    Hgb A1C Lab Results   Component Value Date   HGBA1C 5.2 09/09/2022            Assessment & Plan:   Preventative health maintenance:  Encouraged her to get a flu shot in the fall Tetanus today Encouraged her to get her COVID booster She no longer needs Pap smears Mammogram UTD Encouraged her to consume a balanced diet and exercise regimen Advised her to see an eye doctor and dentist annually We will check lipid, A1c, HIV and hep C today  Deformity of right calf:  Has been cleared by vascular, that this is not a vascular issue Vascular is concerned that it is not a lipoma versus torn calf muscle Vascular referred her to Ortho but she has not heard anything about this appointment, will check referrals to 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

## 2023-12-02 NOTE — Patient Instructions (Signed)

## 2023-12-02 NOTE — Assessment & Plan Note (Signed)
 Encouraged diet and exercise for weight loss ?

## 2023-12-02 NOTE — Assessment & Plan Note (Addendum)
 ER notes, labs and imaging reviewed Encouraged high-fiber diet Okay to take imodium or mirilax OTC as needed She was given referral information to follow-up with GI at the ER, she can call and schedule appointment if she would like further evaluation

## 2023-12-03 ENCOUNTER — Ambulatory Visit: Payer: Self-pay | Admitting: Internal Medicine

## 2023-12-03 DIAGNOSIS — E7800 Pure hypercholesterolemia, unspecified: Secondary | ICD-10-CM

## 2023-12-03 LAB — HIV ANTIBODY (ROUTINE TESTING W REFLEX): HIV 1&2 Ab, 4th Generation: NONREACTIVE

## 2023-12-03 LAB — LIPID PANEL
Cholesterol: 210 mg/dL — ABNORMAL HIGH (ref <200)
HDL: 43 mg/dL — ABNORMAL LOW (ref >=50)
LDL Cholesterol (Calc): 147 mg/dL — ABNORMAL HIGH
Non-HDL Cholesterol (Calc): 167 mg/dL — ABNORMAL HIGH (ref <130)
Total CHOL/HDL Ratio: 4.9 (calc) (ref <5.0)
Triglycerides: 91 mg/dL (ref <150)

## 2023-12-03 LAB — HEMOGLOBIN A1C
Hgb A1c MFr Bld: 5.1 % (ref <5.7)
Mean Plasma Glucose: 100 mg/dL
eAG (mmol/L): 5.5 mmol/L

## 2023-12-03 LAB — HEPATITIS C ANTIBODY: Hepatitis C Ab: NONREACTIVE

## 2023-12-03 MED ORDER — ATORVASTATIN CALCIUM 10 MG PO TABS: 10.0000 mg | ORAL_TABLET | Freq: Every day | ORAL | 1 refills | Status: DC

## 2023-12-28 ENCOUNTER — Ambulatory Visit: Admitting: Obstetrics and Gynecology

## 2024-01-06 ENCOUNTER — Encounter: Payer: Self-pay | Admitting: Obstetrics

## 2024-01-06 NOTE — Progress Notes (Signed)
 ANNUAL PREVENTATIVE CARE GYNECOLOGY  ENCOUNTER NOTE  SUBJECTIVE:       YAFFA SECKMAN is a 43 y.o. G0P0000 female here for a routine annual gynecologic exam. The patient is not currently sexually active. The patient is not taking hormone replacement therapy. Patient denies post-menopausal vaginal bleeding. Family history of breast, uterine, ovarian cancer: yes. The patient wears seatbelts: yes. The patient participates in regular exercise: no. Has the patient ever been transfused or tattooed?: no. The patient reports that there is not domestic violence in her life. Has the patient completed the Gardasil vaccine? no.  Current complaints: 1.  None   Gynecologic History No LMP recorded. Patient has had a hysterectomy. Contraception: status post hysterectomy Last Pap: 2019. Results were: normal History of abnormal pap: none History of STIs: none Last Mammogram: 11/16/23. Results were: abnormal; left breast mass noted.  Diagnostic mammogram 11/23/23 showed benign simple cyst.  Recommend repeat in 1 year. Last Colonoscopy: N/A Last Dexa Scan: N/A  PHQ-2:     01/11/2024    8:14 AM 12/02/2023    8:16 AM  Depression screen PHQ 2/9  Decreased Interest 0 0  Down, Depressed, Hopeless 0 0  PHQ - 2 Score 0 0  Altered sleeping  0  Tired, decreased energy  0  Change in appetite  0  Feeling bad or failure about yourself   0  Trouble concentrating  0  Moving slowly or fidgety/restless  0  Suicidal thoughts  0  PHQ-9 Score  0  Difficult doing work/chores  Not difficult at all    Obstetric History OB History  Gravida Para Term Preterm AB Living  0 0 0 0 0 0  SAB IAB Ectopic Multiple Live Births  0 0 0 0 0    Past Medical History:  Diagnosis Date   ADHD    Anxiety    Depression    Hyperlipidemia    Hypertension     Family History  Problem Relation Age of Onset   Heart disease Mother    Diabetes Father    Breast cancer Maternal Aunt        9 Maternal Aunts had breast cancer     Past Surgical History:  Procedure Laterality Date   ABDOMINAL HYSTERECTOMY     BREAST BIOPSY Left 10/07/2021   Us  Core Bx 11L00 Ribbon Clip - - FIBROSIS SPANNING UP TO 11 MM, ASSOCIATED WITH 3-4 MM APOCRINE CYST. - FOCAL USUAL DUCTAL HYPERPLASIA. - NEGATIVE FOR ATYPIA AND MALIGNANCY    Social History   Socioeconomic History   Marital status: Single    Spouse name: Not on file   Number of children: Not on file   Years of education: Not on file   Highest education level: Some college, no degree  Occupational History   Not on file  Tobacco Use   Smoking status: Never   Smokeless tobacco: Never  Vaping Use   Vaping status: Every Day   Substances: CBD  Substance and Sexual Activity   Alcohol use: Not Currently   Drug use: Not Currently   Sexual activity: Not Currently    Partners: Male    Birth control/protection: Surgical  Other Topics Concern   Not on file  Social History Narrative   Not on file   Social Drivers of Health   Financial Resource Strain: Low Risk  (10/11/2023)   Overall Financial Resource Strain (CARDIA)    Difficulty of Paying Living Expenses: Not hard at all  Food Insecurity: No Food  Insecurity (10/11/2023)   Hunger Vital Sign    Worried About Running Out of Food in the Last Year: Never true    Ran Out of Food in the Last Year: Never true  Transportation Needs: No Transportation Needs (10/11/2023)   PRAPARE - Administrator, Civil Service (Medical): No    Lack of Transportation (Non-Medical): No  Physical Activity: Inactive (10/11/2023)   Exercise Vital Sign    Days of Exercise per Week: 0 days    Minutes of Exercise per Session: Not on file  Stress: No Stress Concern Present (10/11/2023)   [employer] of Occupational Health - Occupational Stress Questionnaire    Feeling of Stress: Only a little  Social Connections: Socially Isolated (10/11/2023)   Social Connection and Isolation Panel    Frequency of Communication with Friends and  Family: Twice a week    Frequency of Social Gatherings with Friends and Family: Twice a week    Attends Religious Services: Never    Database administrator or Organizations: No    Attends Engineer, structural: Not on file    Marital Status: Never married  Intimate Partner Violence: Not on file    Current Outpatient Medications on File Prior to Visit  Medication Sig Dispense Refill   ARIPiprazole (ABILIFY) 5 MG tablet Take 5 mg by mouth daily.     atorvastatin  (LIPITOR) 10 MG tablet Take 1 tablet (10 mg total) by mouth daily. 90 tablet 1   FLUoxetine (PROZAC) 40 MG capsule Take 40 mg by mouth every morning.     fluvoxaMINE (LUVOX) 100 MG tablet Take 100 mg by mouth daily.     lisdexamfetamine (VYVANSE) 20 MG capsule Take 20 mg by mouth every morning.     olmesartan  (BENICAR ) 20 MG tablet TAKE 1 TABLET BY MOUTH EVERY DAY 90 tablet 0   topiramate (TOPAMAX) 25 MG tablet Take 25 mg by mouth 2 (two) times daily.     No current facility-administered medications on file prior to visit.    No Known Allergies   Review of Systems ROS Review of Systems - General ROS: negative for - chills, fatigue, fever, hot flashes, night sweats, weight gain or weight loss Psychological ROS: negative for - anxiety, decreased libido, depression, mood swings, physical abuse or sexual abuse Ophthalmic ROS: negative for - blurry vision, eye pain or loss of vision ENT ROS: negative for - headaches, hearing change, visual changes or vocal changes Allergy and Immunology ROS: negative for - hives, itchy/watery eyes or seasonal allergies Hematological and Lymphatic ROS: negative for - bleeding problems, bruising, swollen lymph nodes or weight loss Endocrine ROS: negative for - galactorrhea, hair pattern changes, hot flashes, malaise/lethargy, mood swings, palpitations, polydipsia/polyuria, skin changes, temperature intolerance or unexpected weight changes Breast ROS: negative for - new or changing breast  lumps or nipple discharge Respiratory ROS: negative for - cough or shortness of breath Cardiovascular ROS: negative for - chest pain, irregular heartbeat, palpitations or shortness of breath Gastrointestinal ROS: no abdominal pain, change in bowel habits, or black or bloody stools Genito-Urinary ROS: no dysuria, trouble voiding, or hematuria Musculoskeletal ROS: negative for - joint pain or joint stiffness Neurological ROS: negative for - bowel and bladder control changes Dermatological ROS: negative for rash and skin lesion changes   OBJECTIVE:   BP 126/87   Pulse 72   Ht 5' 2 (1.575 m)   Wt 168 lb 6.4 oz (76.4 kg)   BMI 30.80 kg/m   CONSTITUTIONAL: Well-developed,  well-nourished female in no acute distress.  PSYCHIATRIC: Normal mood and affect. Normal behavior. Normal judgment and thought content. NEUROLGIC: Alert and oriented to person, place, and time. Normal muscle tone coordination. No cranial nerve deficit noted. HENT:  Normocephalic, atraumatic, External right and left ear normal. Oropharynx is clear and moist EYES: Conjunctivae and EOM are normal. No scleral icterus.  NECK: Normal range of motion, supple, no masses.  Normal thyroid .  SKIN: Skin is warm and dry. No rash noted. Not diaphoretic. No erythema. No pallor. CARDIOVASCULAR: Normal heart rate noted, regular rhythm, no murmur. RESPIRATORY: Clear to auscultation bilaterally. Effort and breath sounds normal, no problems with respiration noted. BREASTS: Symmetric in size. No masses, skin changes, nipple drainage, or lymphadenopathy. ABDOMEN: Soft, normal bowel sounds, no distention noted.  No tenderness, rebound or guarding.  PELVIC:  Bladder no bladder distension noted  Urethra: normal appearing urethra with no masses, tenderness or lesions  Vulva: normal appearing vulva with no masses, tenderness or lesions  Vagina: normal appearing vagina with normal color and discharge, no lesions  Cervix: surgically  absent  Uterus: surgically absent, vaginal cuff well healed  Adnexa: normal adnexa in size, nontender and no masses  RV: External Exam NormaI, No Rectal Masses, and Normal Sphincter tone  MUSCULOSKELETAL: Normal range of motion. No tenderness.  No cyanosis, clubbing, or edema.  2+ distal pulses. LYMPHATIC: No Axillary, Supraclavicular, or Inguinal Adenopathy.  Labs: Lab Results  Component Value Date   WBC 5.0 11/25/2023   HGB 13.3 11/25/2023   HCT 40.0 11/25/2023   MCV 91.5 11/25/2023   PLT 274 11/25/2023    Lab Results  Component Value Date   CREATININE 0.81 11/25/2023   BUN 9 11/25/2023   NA 139 11/25/2023   K 3.5 11/25/2023   CL 106 11/25/2023   CO2 25 11/25/2023    Lab Results  Component Value Date   ALT 15 11/25/2023   AST 17 11/25/2023   ALKPHOS 63 11/25/2023   BILITOT 0.4 11/25/2023    Lab Results  Component Value Date   CHOL 210 (H) 12/02/2023   HDL 43 (L) 12/02/2023   LDLCALC 147 (H) 12/02/2023   TRIG 91 12/02/2023   CHOLHDL 4.9 12/02/2023    Lab Results  Component Value Date   TSH 1.20 06/03/2023    Lab Results  Component Value Date   HGBA1C 5.1 12/02/2023     ASSESSMENT:   No diagnosis found.   PLAN:   TIFFANYE HARTMANN is a 43 y.o. G0P0000 female here today for her annual exam, doing well.  Pap: no longer indicated, s/p hysterectomy Mammogram: done Aug '25 Colon: N/A Labs: PCP PHQ-2 = 0 Contraception: s/p hysterectomy Healthy lifestyle modifications discussed: multivitamin, diet, exercise, sunscreen, tobacco and alcohol use. Emphasized importance of regular physical activity.  Flu vaccine administered today Calcium  and Vit D recommendation reviewed.  All questions answered to patient's satisfaction.   Follow up 1 yr for annual, sooner prn.    Estil Mangle, DO [facility] at [facility]

## 2024-01-10 NOTE — Patient Instructions (Signed)
 Preventive Care 4-43 Years Old, Female Preventive care refers to lifestyle choices and visits with your health care provider that can promote health and wellness. Preventive care visits are also called wellness exams. What can I expect for my preventive care visit? Counseling Your health care provider may ask you questions about your: Medical history, including: Past medical problems. Family medical history. Pregnancy history. Current health, including: Menstrual cycle. Method of birth control. Emotional well-being. Home life and relationship well-being. Sexual activity and sexual health. Lifestyle, including: Alcohol, nicotine or tobacco, and drug use. Access to firearms. Diet, exercise, and sleep habits. Work and work Astronomer. Sunscreen use. Safety issues such as seatbelt and bike helmet use. Physical exam Your health care provider will check your: Height and weight. These may be used to calculate your BMI (body mass index). BMI is a measurement that tells if you are at a healthy weight. Waist circumference. This measures the distance around your waistline. This measurement also tells if you are at a healthy weight and may help predict your risk of certain diseases, such as type 2 diabetes and high blood pressure. Heart rate and blood pressure. Body temperature. Skin for abnormal spots. What immunizations do I need?  Vaccines are usually given at various ages, according to a schedule. Your health care provider will recommend vaccines for you based on your age, medical history, and lifestyle or other factors, such as travel or where you work. What tests do I need? Screening Your health care provider may recommend screening tests for certain conditions. This may include: Lipid and cholesterol levels. Diabetes screening. This is done by checking your blood sugar (glucose) after you have not eaten for a while (fasting). Pelvic exam and Pap test. Hepatitis B test. Hepatitis C  test. HIV (human immunodeficiency virus) test. STI (sexually transmitted infection) testing, if you are at risk. Lung cancer screening. Colorectal cancer screening. Mammogram. Talk with your health care provider about when you should start having regular mammograms. This may depend on whether you have a family history of breast cancer. BRCA-related cancer screening. This may be done if you have a family history of breast, ovarian, tubal, or peritoneal cancers. Bone density scan. This is done to screen for osteoporosis. Talk with your health care provider about your test results, treatment options, and if necessary, the need for more tests. Follow these instructions at home: Eating and drinking  Eat a diet that includes fresh fruits and vegetables, whole grains, lean protein, and low-fat dairy products. Take vitamin and mineral supplements as recommended by your health care provider. Do not drink alcohol if: Your health care provider tells you not to drink. You are pregnant, may be pregnant, or are planning to become pregnant. If you drink alcohol: Limit how much you have to 0-1 drink a day. Know how much alcohol is in your drink. In the [place] one drink equals one 12 oz bottle of beer (355 mL), one 5 oz glass of wine (148 mL), or one 1 oz glass of hard liquor (44 mL). Lifestyle Brush your teeth every morning and night with fluoride toothpaste. Floss one time each day. Exercise for at least 30 minutes 5 or more days each week. Do not use any products that contain nicotine or tobacco. These products include cigarettes, chewing tobacco, and vaping devices, such as e-cigarettes. If you need help quitting, ask your health care provider. Do not use drugs. If you are sexually active, practice safe sex. Use a condom or other form of protection to  prevent STIs. If you do not wish to become pregnant, use a form of birth control. If you plan to become pregnant, see your health care provider for a  prepregnancy visit. Take aspirin only as told by your health care provider. Make sure that you understand how much to take and what form to take. Work with your health care provider to find out whether it is safe and beneficial for you to take aspirin daily. Find healthy ways to manage stress, such as: Meditation, yoga, or listening to music. Journaling. Talking to a trusted person. Spending time with friends and family. Minimize exposure to UV radiation to reduce your risk of skin cancer. Safety Always wear your seat belt while driving or riding in a vehicle. Do not drive: If you have been drinking alcohol. Do not ride with someone who has been drinking. When you are tired or distracted. While texting. If you have been using any mind-altering substances or drugs. Wear a helmet and other protective equipment during sports activities. If you have firearms in your house, make sure you follow all gun safety procedures. Seek help if you have been physically or sexually abused. What's next? Visit your health care provider once a year for an annual wellness visit. Ask your health care provider how often you should have your eyes and teeth checked. Stay up to date on all vaccines. This information is not intended to replace advice given to you by your health care provider. Make sure you discuss any questions you have with your health care provider. Document Revised: 09/25/2020 Document Reviewed: 09/25/2020 Elsevier Patient Education  2024 [employer].

## 2024-01-11 ENCOUNTER — Encounter: Payer: Self-pay | Admitting: Obstetrics

## 2024-01-11 ENCOUNTER — Ambulatory Visit: Admitting: Obstetrics

## 2024-01-11 VITALS — BP 126/87 | HR 72 | Ht 62.0 in | Wt 168.4 lb

## 2024-01-11 DIAGNOSIS — Z23 Encounter for immunization: Secondary | ICD-10-CM

## 2024-01-11 DIAGNOSIS — Z01419 Encounter for gynecological examination (general) (routine) without abnormal findings: Secondary | ICD-10-CM | POA: Diagnosis not present

## 2024-01-27 ENCOUNTER — Ambulatory Visit (INDEPENDENT_AMBULATORY_CARE_PROVIDER_SITE_OTHER): Admitting: Internal Medicine

## 2024-01-27 ENCOUNTER — Encounter: Payer: Self-pay | Admitting: Internal Medicine

## 2024-01-27 VITALS — BP 122/84 | Ht 62.0 in | Wt 172.6 lb

## 2024-01-27 DIAGNOSIS — R42 Dizziness and giddiness: Secondary | ICD-10-CM

## 2024-01-27 DIAGNOSIS — R251 Tremor, unspecified: Secondary | ICD-10-CM

## 2024-01-27 DIAGNOSIS — R61 Generalized hyperhidrosis: Secondary | ICD-10-CM

## 2024-01-27 NOTE — Patient Instructions (Signed)
Serotonin Syndrome Serotonin is a chemical that helps to control several functions in the body. This chemical is also called a neurotransmitter. It controls: Brain and nerve cell function. Mood and emotions. Memory. Eating. Sleeping. Sexual activity. Stress response. Having too much serotonin in your body can cause serotonin syndrome. This condition can be harmful to your brain and nerve cells. This can be a life-threatening condition. What are the causes? This condition may be caused by taking medicines or drugs that increase the level of serotonin in your body, such as: Antidepressant medicines. Migraine medicines. Certain pain medicines. Certain drugs, including ecstasy, LSD, cocaine, and amphetamines. Over-the-counter cough or cold medicines that contain dextromethorphan. Certain herbal supplements, including St. John's wort, ginseng, and nutmeg. This condition usually occurs when you take these medicines or drugs together, but it can also happen with a high dose of a single medicine or drug. What increases the risk? You are more likely to develop this condition if: You just started taking a medicine or drug that increases the level of serotonin in the body. You recently increased the dose of a medicine or drug that increases the level of serotonin in the body. You take more than one medicine or drug that increases the level of serotonin in the body. What are the signs or symptoms? Symptoms of this condition usually start within several hours of taking a medicine or drug. Symptoms may be mild or severe. Mild symptoms include: Sweating. Restlessness or agitation. Muscle twitching or stiffness. Rapid heart rate. Nausea, vomiting, or diarrhea. Shivering or goose bumps. Confusion. Severe symptoms include: Irregular heartbeat. Seizures. Loss of consciousness. High fever. How is this diagnosed? This condition may be diagnosed based on: Your medical history. A physical  exam. Your prior use of drugs and medicines. Blood or urine tests. These may be used to rule out other causes of your symptoms. How is this treated? The treatment for this condition depends on the severity of your symptoms. For mild cases, stopping the medicine or drug that caused your condition is usually all that is needed. For moderate to severe cases, treatment in a hospital may be needed to prevent or treat life-threatening symptoms. Treatment may include: Medicines to control your symptoms. IV fluids. Actions to support your breathing. Treatments to control your body temperature. Follow these instructions at home: Medicines  Take over-the-counter and prescription medicines only as told by your health care provider. Check with your health care provider before you start taking any new prescriptions, over-the-counter medicines, herbs, or supplements. Do not combine any medicines that can cause this condition. Lifestyle  Maintain a healthy lifestyle. Eat a healthy diet that includes plenty of vegetables, fruits, whole grains, low-fat dairy products, and lean protein. Do not eat a lot of foods that are high in fat, added sugars, or salt. Get the right amount and quality of sleep. Most adults need 7-9 hours of sleep each night. Make time to exercise, even if it is only for short periods of time. Most adults should exercise for at least 150 minutes each week. Do not drink alcohol. Do not use illegal drugs. Do not take medicines for reasons other than they are prescribed. General instructions Do not use any products that contain nicotine or tobacco. These products include cigarettes, chewing tobacco, and vaping devices, such as e-cigarettes. If you need help quitting, ask your health care provider. Contact a health care provider if: Your symptoms do not improve or they get worse. Get help right away if: You have worsening  confusion, severe headache, chest pain, high fever, seizures, or  loss of consciousness. You experience serious side effects of medicine, such as swelling of your face, lips, tongue, or throat. These symptoms may be an emergency. Get help right away. Call 911. Do not wait to see if the symptoms will go away. Do not drive yourself to the hospital. Also, get help right away if: You have serious thoughts about hurting yourself or others. Take one of these steps if you feel like you may hurt yourself or others, or have thoughts about taking your own life: Go to your nearest emergency room. Call 911. Call the National Suicide Prevention Lifeline at 276-283-0524 or 988. This is open 24 hours a day. Text the Crisis Text Line at 337-730-6713. Summary Serotonin is a chemical that helps to control several functions in the body. High levels of serotonin in the body can cause serotonin syndrome, which can be life-threatening. This condition may be caused by taking medicines or drugs that increase the level of serotonin in your body. Treatment depends on the severity of your symptoms. For mild cases, stopping the medicine or drug that caused your condition is usually all that is needed. Check with your health care provider before you start taking any new prescriptions, over-the-counter medicines, herbs, or supplements. This information is not intended to replace advice given to you by your health care provider. Make sure you discuss any questions you have with your health care provider. Document Revised: 06/19/2021 Document Reviewed: 06/19/2021 Elsevier Patient Education  2024 [employer].

## 2024-01-27 NOTE — Progress Notes (Signed)
 Subjective:    Patient ID: Angela Aguirre, female    DOB: [birth year], 43 y.o.   MRN: [id number]  HPI  Discussed the use of AI scribe software for clinical note transcription with the patient, who gave verbal consent to proceed.  Angela Aguirre is a 43 year old female who presents with tremors, sweating, and dizziness.  She has been experiencing tremors primarily in her hands, excessive sweating on her face and neck, and dizziness described as feeling off balance. These symptoms have been present intermittently for the past two days and seem to improve after eating.  She denies going extended period of time without eating.  No recent changes in diet or medication.  She does not consume alcohol but drinks a lot of juice. No headaches, vision changes, chest pain, shortness of breath, nausea, vomiting, or diarrhea. Her symptoms have resolved today, and she is not experiencing any issues at the time of the visit.  Her current medications include aripiprazole, topiramate fluoxetine and fluvoxamine, which she has been taking for years. She has a past medical history of being evaluated for hyperthyroidism, but it was not confirmed.       Review of Systems   Past Medical History:  Diagnosis Date   ADHD    Anxiety    Depression    Hyperlipidemia    Hypertension     Current Outpatient Medications  Medication Sig Dispense Refill   ARIPiprazole (ABILIFY) 5 MG tablet Take 5 mg by mouth daily.     atorvastatin  (LIPITOR) 10 MG tablet Take 1 tablet (10 mg total) by mouth daily. 90 tablet 1   FLUoxetine (PROZAC) 40 MG capsule Take 40 mg by mouth every morning.     fluvoxaMINE (LUVOX) 100 MG tablet Take 100 mg by mouth daily.     lisdexamfetamine (VYVANSE) 20 MG capsule Take 20 mg by mouth every morning.     olmesartan  (BENICAR ) 20 MG tablet TAKE 1 TABLET BY MOUTH EVERY DAY 90 tablet 0   topiramate (TOPAMAX) 25 MG tablet Take 25 mg by mouth 2 (two) times daily.     No current  facility-administered medications for this visit.    No Known Allergies  Family History  Problem Relation Age of Onset   Heart disease Mother    Diabetes Father    Breast cancer Maternal Aunt        9 Maternal Aunts had breast cancer    Social History   Socioeconomic History   Marital status: Single    Spouse name: Not on file   Number of children: Not on file   Years of education: Not on file   Highest education level: 12th grade  Occupational History   Not on file  Tobacco Use   Smoking status: Never   Smokeless tobacco: Never  Vaping Use   Vaping status: Every Day   Substances: CBD  Substance and Sexual Activity   Alcohol use: Not Currently   Drug use: Not Currently   Sexual activity: Not Currently    Partners: Male    Birth control/protection: Surgical  Other Topics Concern   Not on file  Social History Narrative   Not on file   Social Drivers of Health   Financial Resource Strain: Low Risk  (01/26/2024)   Overall Financial Resource Strain (CARDIA)    Difficulty of Paying Living Expenses: Not hard at all  Food Insecurity: No Food Insecurity (01/26/2024)   Hunger Vital Sign    Worried About Running Out  of Food in the Last Year: Never true    Ran Out of Food in the Last Year: Never true  Transportation Needs: No Transportation Needs (01/26/2024)   PRAPARE - Administrator, Civil Service (Medical): No    Lack of Transportation (Non-Medical): No  Physical Activity: Inactive (01/26/2024)   Exercise Vital Sign    Days of Exercise per Week: 0 days    Minutes of Exercise per Session: Not on file  Stress: No Stress Concern Present (01/26/2024)   [employer] of Occupational Health - Occupational Stress Questionnaire    Feeling of Stress: Not at all  Social Connections: Socially Isolated (01/26/2024)   Social Connection and Isolation Panel    Frequency of Communication with Friends and Family: Once a week    Frequency of Social Gatherings with  Friends and Family: Once a week    Attends Religious Services: Never    Database administrator or Organizations: No    Attends Engineer, structural: Not on file    Marital Status: Never married  Intimate Partner Violence: Not on file     Constitutional: Denies fever, malaise, fatigue, headache or abrupt weight changes.  HEENT: Denies eye pain, eye redness, ear pain, ringing in the ears, wax buildup, runny nose, nasal congestion, bloody nose, or sore throat. Respiratory: Denies difficulty breathing, shortness of breath, cough or sputum production.   Cardiovascular: Denies chest pain, chest tightness, palpitations or swelling in the hands or feet.  Gastrointestinal: Pt reports alternating constipation and diarrhea. Denies abdominal pain, bloating, or blood in the stool.  GU: Denies urgency, frequency, pain with urination, burning sensation, blood in urine, odor or discharge. Musculoskeletal: Denies decrease in range of motion, difficulty with gait, muscle pain or joint pain and swelling.  Skin: Patient reports excessive sweating.  Denies redness, rashes, lesions or ulcercations.  Neurological: Patient reports inattention, tremors, dizziness.  Denies difficulty with memory, difficulty with speech or problems with balance and coordination.  Psych: Pt has a history of depression. Denies anxiety, SI/HI.  No other specific complaints in a complete review of systems (except as listed in HPI above).      Objective:   Physical Exam  BP 122/84 (BP Location: Right Arm, Patient Position: Sitting, Cuff Size: Normal)   Ht 5' 2 (1.575 m)   Wt 172 lb 9.6 oz (78.3 kg)   BMI 31.57 kg/m    Wt Readings from Last 3 Encounters:  01/11/24 168 lb 6.4 oz (76.4 kg)  12/02/23 162 lb 9.6 oz (73.8 kg)  11/25/23 162 lb 4.1 oz (73.6 kg)    General: Appears her stated age, obese, in NAD. Skin: Warm, dry and intact. HEENT: Head: normal shape and size; Eyes: sclera white, no icterus, conjunctiva  pink, PERRLA and EOMs intact;  Cardiovascular: Normal rate and rhythm. S1,S2 noted.  No murmur, rubs or gallops noted.  Pulmonary/Chest: Normal effort and positive vesicular breath sounds. No respiratory distress. No wheezes, rales or ronchi noted.  Musculoskeletal: No joint swelling noted.  No difficulty with gait.  Neurological: Alert and oriented.  Cranial nerves II through XII intact.  Coordination normal.  No tremor noted. Psychiatric: Mood and affect normal. Behavior is normal. Judgment and thought content normal.    BMET    Component Value Date/Time   NA 139 11/25/2023 1040   NA 140 09/09/2022 0851   K 3.5 11/25/2023 1040   CL 106 11/25/2023 1040   CO2 25 11/25/2023 1040   GLUCOSE 98 11/25/2023 1040  BUN 9 11/25/2023 1040   BUN 8 09/09/2022 0851   CREATININE 0.81 11/25/2023 1040   CREATININE 0.73 06/03/2023 1100   CALCIUM  9.1 11/25/2023 1040   GFRNONAA >60 11/25/2023 1040    Lipid Panel     Component Value Date/Time   CHOL 210 (H) 12/02/2023 0834   CHOL 186 09/09/2022 0851   TRIG 91 12/02/2023 0834   HDL 43 (L) 12/02/2023 0834   HDL 46 09/09/2022 0851   CHOLHDL 4.9 12/02/2023 0834   LDLCALC 147 (H) 12/02/2023 0834    CBC    Component Value Date/Time   WBC 5.0 11/25/2023 1040   RBC 4.37 11/25/2023 1040   HGB 13.3 11/25/2023 1040   HGB 13.2 09/09/2022 0851   HCT 40.0 11/25/2023 1040   HCT 39.2 09/09/2022 0851   PLT 274 11/25/2023 1040   PLT 221 09/09/2022 0851   MCV 91.5 11/25/2023 1040   MCV 93 09/09/2022 0851   MCH 30.4 11/25/2023 1040   MCHC 33.3 11/25/2023 1040   RDW 12.9 11/25/2023 1040   RDW 12.1 09/09/2022 0851    Hgb A1C Lab Results  Component Value Date   HGBA1C 5.1 12/02/2023            Assessment & Plan:  Assessment and Plan    Dizziness, sweating, tremors Intermittent tremors, excessive sweating, and dizziness possibly due to a mild serotonin syndrome from fluoxetine and fluvoxamine use. Symptoms resolved today. Differential  includes hypoglycemia, dehydration. Explained potential symptom progression. Symptoms improved post-eating, suggesting possible hypoglycemia. - Order metabolic panel to assess liver, kidneys, and electrolytes. - Advise her to contact psychiatrist regarding fluoxetine and fluvoxamine use. - Instruct her to report if symptoms recur.  RTC in 4 months, follow-up chronic conditions Angeline Laura, NP

## 2024-01-28 ENCOUNTER — Ambulatory Visit: Payer: Self-pay | Admitting: Internal Medicine

## 2024-01-28 LAB — COMPREHENSIVE METABOLIC PANEL WITH GFR
AG Ratio: 1.7 (calc) (ref 1.0–2.5)
ALT: 20 U/L (ref 6–29)
AST: 14 U/L (ref 10–30)
Albumin: 4.2 g/dL (ref 3.6–5.1)
Alkaline phosphatase (APISO): 76 U/L (ref 31–125)
BUN: 10 mg/dL (ref 7–25)
CO2: 25 mmol/L (ref 20–32)
Calcium: 9.1 mg/dL (ref 8.6–10.2)
Chloride: 105 mmol/L (ref 98–110)
Creat: 0.73 mg/dL (ref 0.50–0.99)
Globulin: 2.5 g/dL (ref 1.9–3.7)
Glucose, Bld: 90 mg/dL (ref 65–99)
Potassium: 4.1 mmol/L (ref 3.5–5.3)
Sodium: 140 mmol/L (ref 135–146)
Total Bilirubin: 0.3 mg/dL (ref 0.2–1.2)
Total Protein: 6.7 g/dL (ref 6.1–8.1)
eGFR: 105 mL/min/1.73m2 (ref >=60)

## 2024-02-01 DIAGNOSIS — F319 Bipolar disorder, unspecified: Secondary | ICD-10-CM | POA: Diagnosis not present

## 2024-02-04 ENCOUNTER — Other Ambulatory Visit: Payer: Self-pay | Admitting: Internal Medicine

## 2024-02-05 NOTE — Telephone Encounter (Signed)
 Requested Prescriptions  Pending Prescriptions Disp Refills   olmesartan  (BENICAR ) 20 MG tablet [Pharmacy Med Name: OLMESARTAN  MEDOXOMIL 20 MG TAB] 90 tablet 0    Sig: TAKE 1 TABLET BY MOUTH EVERY DAY     Cardiovascular:  Angiotensin Receptor Blockers Passed - 02/05/2024  8:49 AM      Passed - Cr in normal range and within 180 days    Creat  Date Value Ref Range Status  01/27/2024 0.73 0.50 - 0.99 mg/dL Final         Passed - K in normal range and within 180 days    Potassium  Date Value Ref Range Status  01/27/2024 4.1 3.5 - 5.3 mmol/L Final         Passed - Patient is not pregnant      Passed - Last BP in normal range    BP Readings from Last 1 Encounters:  01/27/24 122/84         Passed - Valid encounter within last 6 months    Recent Outpatient Visits           1 week ago Dizziness   [place] [facility] [employer], Angeline ORN, NP   2 months ago Encounter for general adult medical examination with abnormal findings   [place] [facility] [place], Angeline ORN, NP   3 months ago Right leg swelling   [place] [facility] [place], Angeline ORN, NP   6 months ago Swelling of calf   [place] [facility] [place], Angeline ORN, NP   8 months ago Pure hypercholesterolemia   [place] [facility] [place], Angeline ORN, [place]

## 2024-03-04 ENCOUNTER — Other Ambulatory Visit: Payer: Self-pay | Admitting: Internal Medicine

## 2024-03-06 NOTE — Telephone Encounter (Signed)
 Requested Prescriptions  Pending Prescriptions Disp Refills   atorvastatin  (LIPITOR) 10 MG tablet [Pharmacy Med Name: ATORVASTATIN  10MG  TABLETS] 90 tablet 1    Sig: TAKE 1 TABLET(10 MG) BY MOUTH DAILY     Cardiovascular:  Antilipid - Statins Failed - 03/06/2024  1:04 PM      Failed - Lipid Panel in normal range within the last 12 months    Cholesterol, Total  Date Value Ref Range Status  09/09/2022 186 100 - 199 mg/dL Final   Cholesterol  Date Value Ref Range Status  12/02/2023 210 (H) <200 mg/dL Final   LDL Cholesterol (Calc)  Date Value Ref Range Status  12/02/2023 147 (H) mg/dL (calc) Final    Comment:    Reference range: <100 . Desirable range <100 mg/dL for primary prevention;   <70 mg/dL for patients with CHD or diabetic patients  with > or = 2 CHD risk factors. SABRA LDL-C is now calculated using the Martin-Hopkins  calculation, which is a validated novel method providing  better accuracy than the Friedewald equation in the  estimation of LDL-C.  Gladis APPLETHWAITE et al. SANDREA. 7986;689(80): 2061-2068  (http://education.[facility].com/faq/FAQ164)    HDL  Date Value Ref Range Status  12/02/2023 43 (L) > OR = 50 mg/dL Final  94/70/7975 46 >60 mg/dL Final   Triglycerides  Date Value Ref Range Status  12/02/2023 91 <150 mg/dL Final         Passed - Patient is not pregnant      Passed - Valid encounter within last 12 months    Recent Outpatient Visits           1 month ago Dizziness   [place] [facility] [place], Angeline ORN, NP   3 months ago Encounter for general adult medical examination with abnormal findings   [place] [facility] [place], Angeline ORN, NP   4 months ago Right leg swelling   [place] [facility] [place], Angeline ORN, NP   7 months ago Swelling of calf   [place] [facility] [place], Angeline ORN, NP   9 months ago Pure hypercholesterolemia    [facility]  [place], Angeline ORN, [place]

## 2024-03-07 ENCOUNTER — Other Ambulatory Visit

## 2024-03-13 ENCOUNTER — Other Ambulatory Visit

## 2024-03-13 DIAGNOSIS — E7800 Pure hypercholesterolemia, unspecified: Secondary | ICD-10-CM

## 2024-03-13 LAB — LIPID PANEL
Cholesterol: 159 mg/dL (ref <200)
HDL: 39 mg/dL — ABNORMAL LOW (ref >=50)
LDL Cholesterol (Calc): 103 mg/dL — ABNORMAL HIGH
Non-HDL Cholesterol (Calc): 120 mg/dL (ref <130)
Total CHOL/HDL Ratio: 4.1 (calc) (ref <5.0)
Triglycerides: 80 mg/dL (ref <150)

## 2024-03-14 ENCOUNTER — Ambulatory Visit: Payer: Self-pay | Admitting: Internal Medicine

## 2024-04-11 ENCOUNTER — Encounter: Payer: Self-pay | Admitting: Internal Medicine

## 2024-04-28 IMAGING — MG DIGITAL DIAGNOSTIC BILAT W/ TOMO W/ CAD
8 series · 8 of 24 positions shown · non-contrast
Comparison: Previous exam(s).

CLINICAL DATA: Callback for bilateral asymmetries from baseline
mammogram

EXAM:
DIGITAL DIAGNOSTIC BILATERAL MAMMOGRAM WITH TOMOSYNTHESIS AND CAD;
ULTRASOUND LEFT BREAST LIMITED
TECHNIQUE: Bilateral digital diagnostic mammography and breast tomosynthesis
was performed. The images were evaluated with computer-aided
detection.; Targeted ultrasound examination of the left breast was
performed.

[R ML synth-2D]
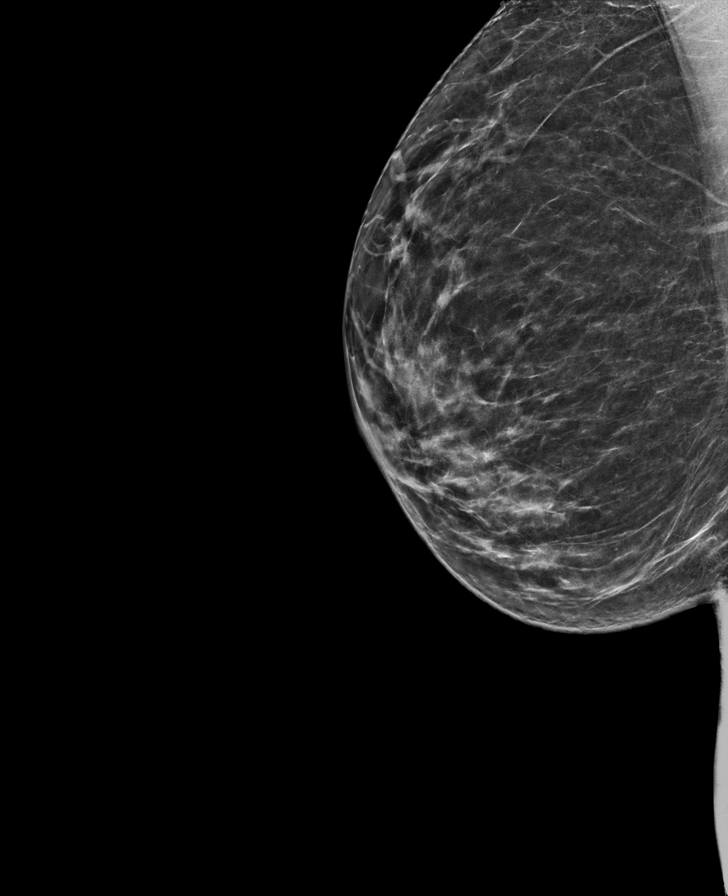

[R MLO synth-2D]
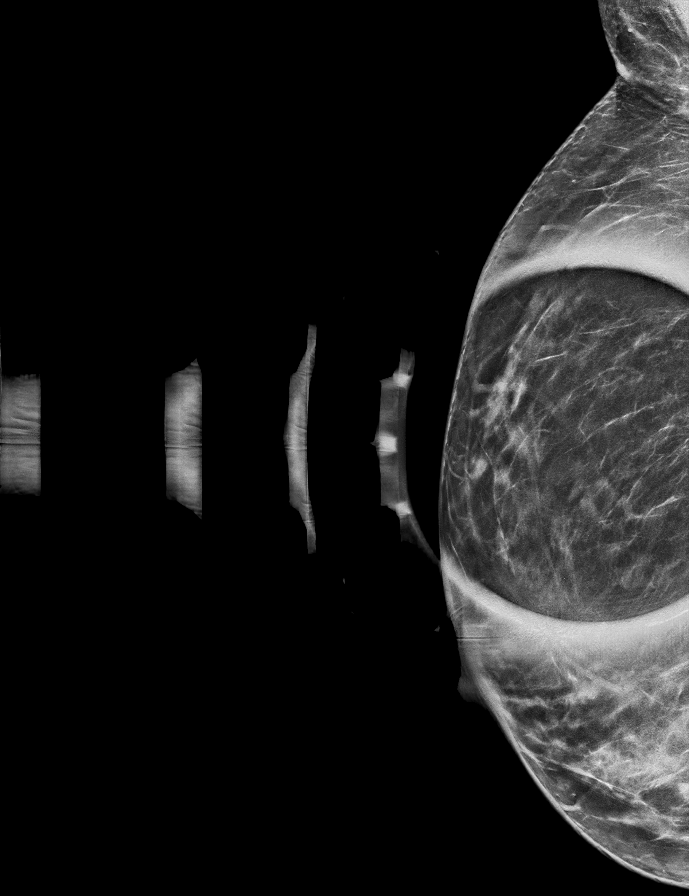

[L MLO synth-2D]
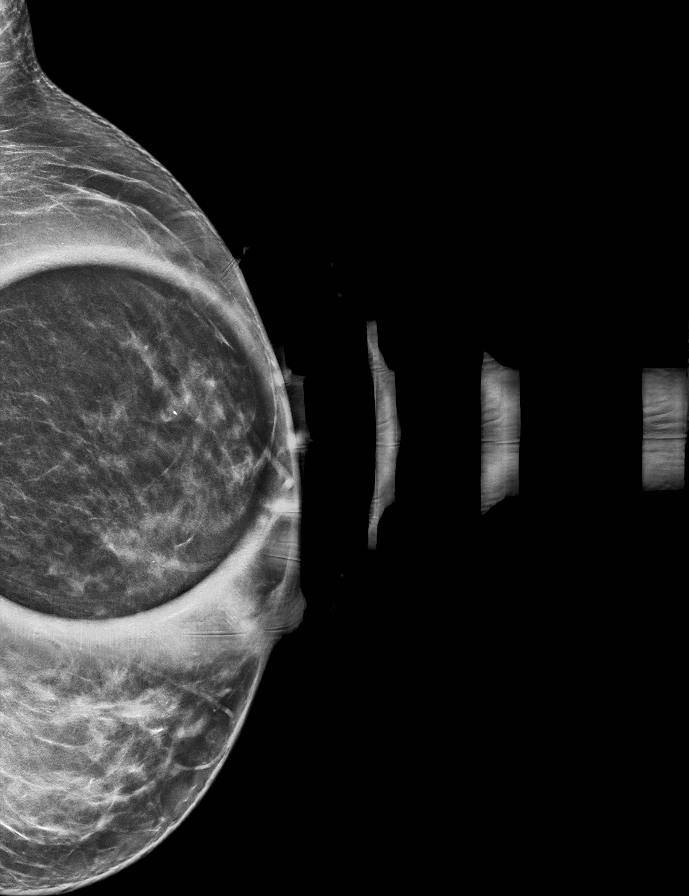

[L CC synth-2D]
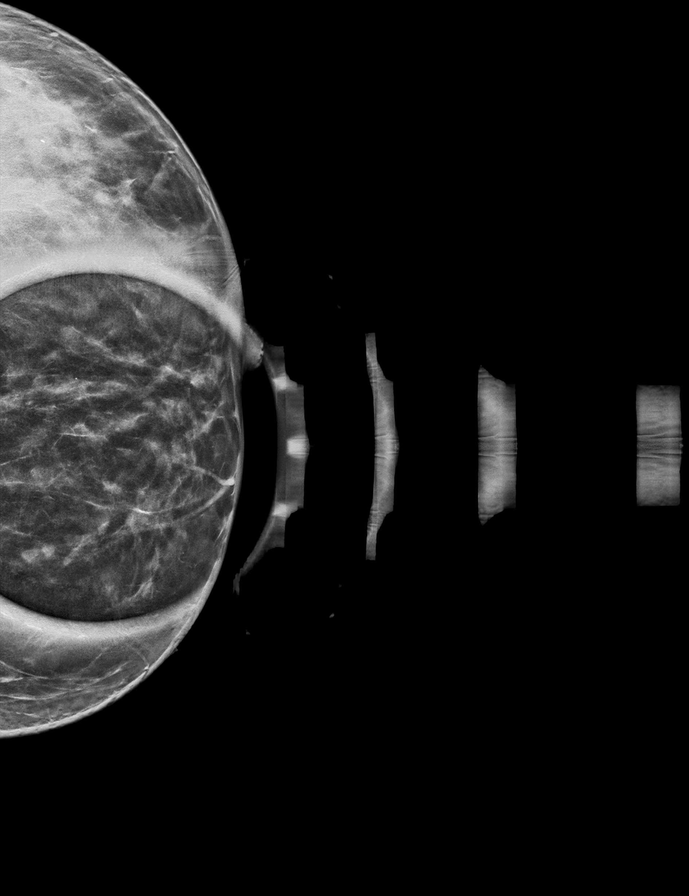

[R ML tomo · tomo slice 38/75.0]
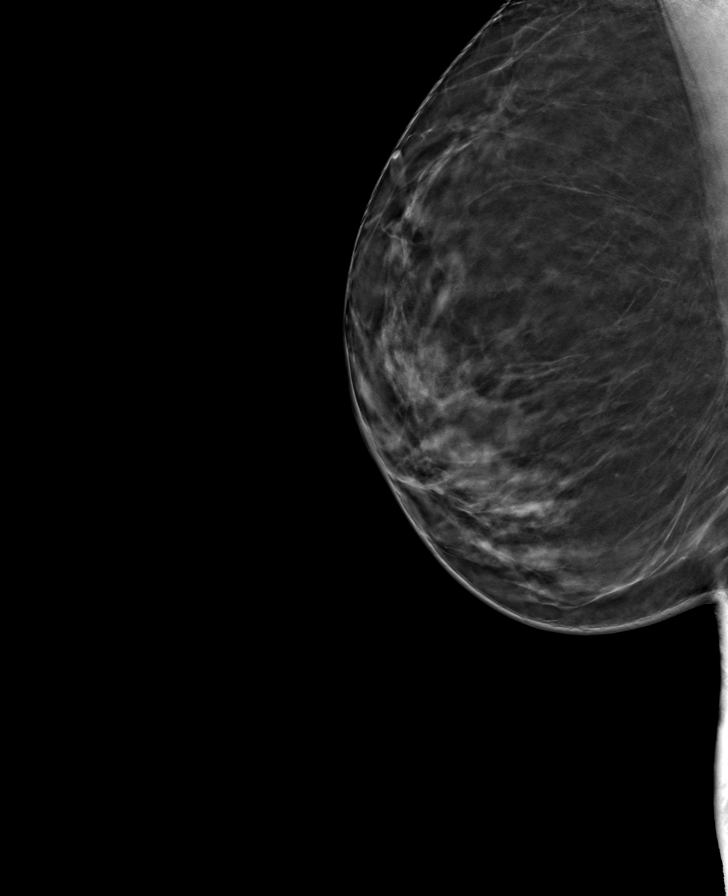

[R MLO tomo · tomo slice 33/64.0]
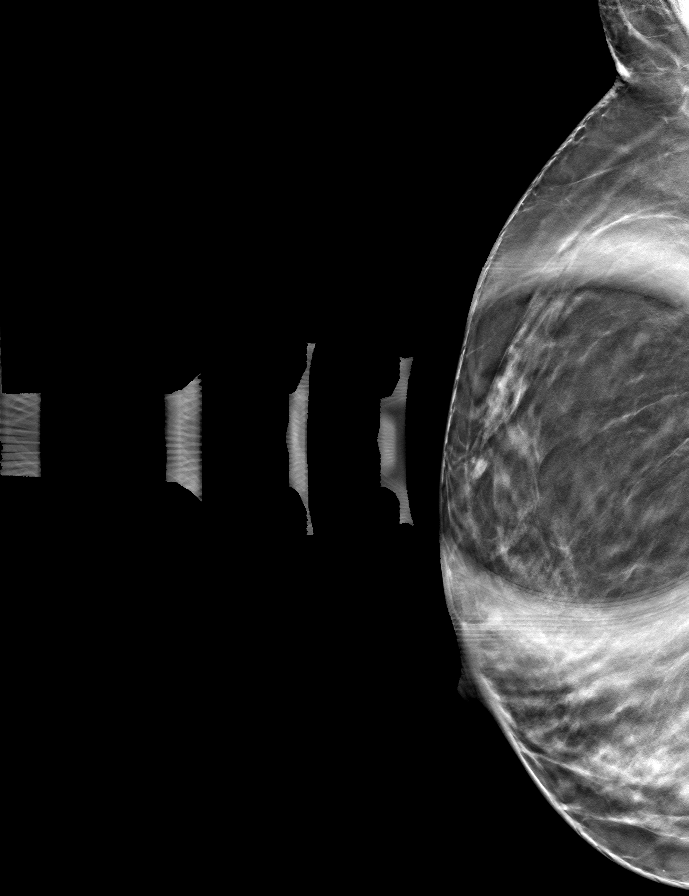

[L MLO tomo · tomo slice 32/63.0]
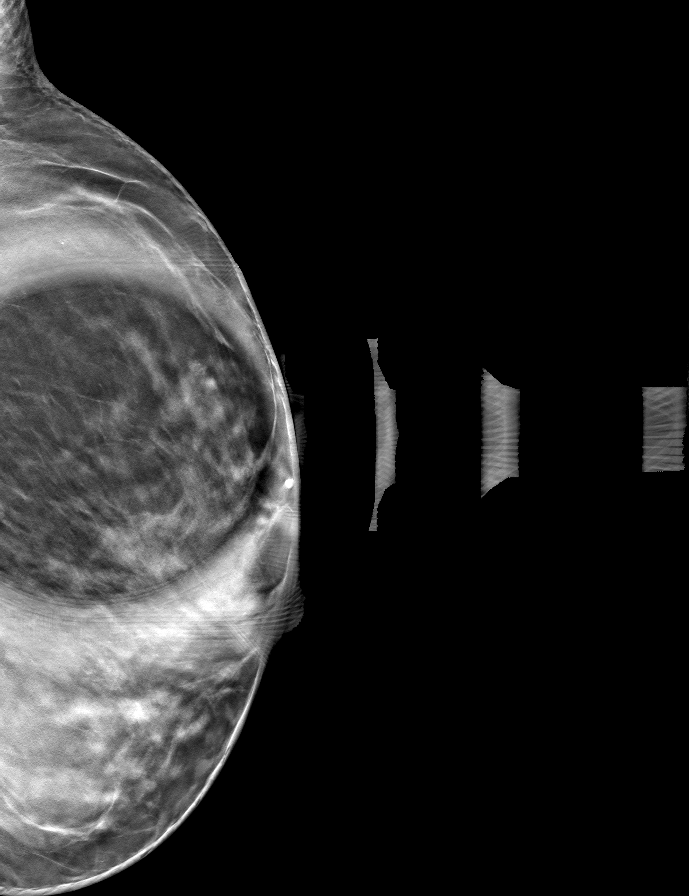

[L CC tomo · tomo slice 28/55.0]
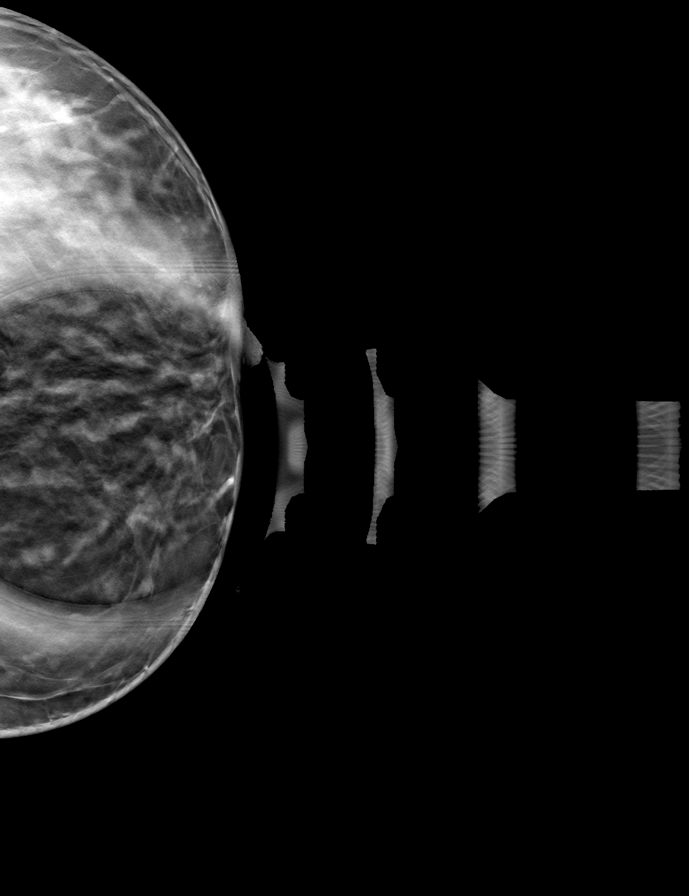

[8 of 24 positions shown; findings below may reference images not displayed]

ACR Breast Density Category c: The breast tissue is heterogeneously
dense, which may obscure small masses.
FINDINGS: The previously described finding in the RIGHT breast does not
persist with additional views, consistent with superimposed
fibroglandular tissue. No suspicious mass, microcalcification, or
other finding is identified.

Spot compression tomosynthesis views of the LEFT breast demonstrate
a persistent tubular asymmetry as well as several scattered oval
circumscribed masses throughout the upper breast on spot CC imaging.
Asymmetry noted on MLO resolves with additional views.

On physical exam, no suspicious mass is appreciated.

Targeted ultrasound was performed of the LEFT upper breast. At 11
o'clock 3 cm from the nipple, there is an oval circumscribed
anechoic mass posterior acoustic enhancement. It measures 4 x 4 by 4
mm and is consistent with benign cyst. Adjacent to this at 11
o'clock 2 cm from the nipple is an irregular hypoechoic masslike
area with angular margins which spans 12 x 3 by 4 mm. This may
correspond to the tubular asymmetry noted mammographically. Multiple
additional tiny benign cysts are noted during real-time examination
throughout the upper breast. Benign duct ectasia is noted. It is
unclear whether the tubular asymmetry noted mammographically
corresponds to the masslike area at 11 o'clock 2 cm from the nipple
versus one of the innumerable additional areas of benign duct
ectasia.

Targeted ultrasound was performed LEFT axilla. No suspicious
axillary lymph nodes are seen.
IMPRESSION: 1. There is an indeterminate sonographically identified 12 mm
masslike area at 11 o'clock 2 cm from the nipple. Recommend
ultrasound-guided biopsy for definitive characterization.
2. No suspicious LEFT axillary adenopathy.
3. No mammographic evidence of malignancy in the RIGHT breast.

RECOMMENDATION:
LEFT breast ultrasound-guided biopsy x1

I have discussed the findings and recommendations with the patient.
The biopsy procedure was discussed with the patient and questions
were answered. Patient expressed their understanding of the biopsy
recommendation. Patient will be scheduled for biopsy at her earliest
convenience by the schedulers. Ordering provider will be notified.
If applicable, a reminder letter will be sent to the patient
regarding the next appointment.

BI-RADS CATEGORY  4: Suspicious.

## 2024-05-04 ENCOUNTER — Other Ambulatory Visit: Payer: Self-pay | Admitting: Internal Medicine

## 2024-05-04 NOTE — Telephone Encounter (Signed)
 Requested Prescriptions  Pending Prescriptions Disp Refills   olmesartan  (BENICAR ) 20 MG tablet [Pharmacy Med Name: OLMESARTAN  MEDOXOMIL 20 MG TAB] 90 tablet 0    Sig: TAKE 1 TABLET BY MOUTH EVERY DAY     Cardiovascular:  Angiotensin Receptor Blockers Passed - 05/04/2024 11:32 AM      Passed - Cr in normal range and within 180 days    Creat  Date Value Ref Range Status  01/27/2024 0.73 0.50 - 0.99 mg/dL Final         Passed - K in normal range and within 180 days    Potassium  Date Value Ref Range Status  01/27/2024 4.1 3.5 - 5.3 mmol/L Final         Passed - Patient is not pregnant      Passed - Last BP in normal range    BP Readings from Last 1 Encounters:  01/27/24 122/84         Passed - Valid encounter within last 6 months    Recent Outpatient Visits           3 months ago Dizziness   [place] [facility] [place], Angeline ORN, NP   5 months ago Encounter for general adult medical examination with abnormal findings   [place] [facility] [place], Angeline ORN, NP   6 months ago Right leg swelling   [facility] [place], Angeline ORN, NP   9 months ago Swelling of calf   [place] [facility] [place], Angeline ORN, NP   11 months ago Pure hypercholesterolemia   [place] [facility] [place], Angeline ORN, [place]

## 2024-05-30 ENCOUNTER — Ambulatory Visit: Admitting: Internal Medicine
